# Patient Record
Sex: Female | Born: 1950 | Race: White | Hispanic: No | Smoking: Former smoker
Health system: Southern US, Community
[De-identification: ages and names within clinical notes are randomized; demographics above are authoritative.]

## PROBLEM LIST (undated history)

## (undated) DIAGNOSIS — M1990 Unspecified osteoarthritis, unspecified site: Secondary | ICD-10-CM

## (undated) DIAGNOSIS — E079 Disorder of thyroid, unspecified: Secondary | ICD-10-CM

## (undated) DIAGNOSIS — B019 Varicella without complication: Secondary | ICD-10-CM

## (undated) DIAGNOSIS — H409 Unspecified glaucoma: Secondary | ICD-10-CM

## (undated) HISTORY — PX: SPINE SURGERY: SHX786

## (undated) HISTORY — PX: GLAUCOMA SURGERY: SHX656

## (undated) HISTORY — DX: Varicella without complication: B01.9

## (undated) HISTORY — DX: Unspecified osteoarthritis, unspecified site: M19.90

## (undated) HISTORY — DX: Unspecified glaucoma: H40.9

## (undated) HISTORY — DX: Disorder of thyroid, unspecified: E07.9

## (undated) HISTORY — PX: BUNIONECTOMY: SHX129

## (undated) HISTORY — PX: CATARACT EXTRACTION W/ INTRAOCULAR LENS IMPLANT: SHX1309

---

## 1988-06-09 HISTORY — PX: GALLBLADDER SURGERY: SHX652

## 2014-09-19 DIAGNOSIS — H4011X1 Primary open-angle glaucoma, mild stage: Secondary | ICD-10-CM | POA: Diagnosis not present

## 2014-10-13 DIAGNOSIS — M7551 Bursitis of right shoulder: Secondary | ICD-10-CM | POA: Diagnosis not present

## 2014-10-30 ENCOUNTER — Encounter: Payer: Self-pay | Admitting: Primary Care

## 2014-10-30 ENCOUNTER — Ambulatory Visit (INDEPENDENT_AMBULATORY_CARE_PROVIDER_SITE_OTHER): Payer: Medicare Other | Admitting: Primary Care

## 2014-10-30 VITALS — BP 138/88 | HR 78 | Temp 98.0°F | Ht 63.0 in | Wt 243.8 lb

## 2014-10-30 DIAGNOSIS — R600 Localized edema: Secondary | ICD-10-CM | POA: Insufficient documentation

## 2014-10-30 DIAGNOSIS — M1990 Unspecified osteoarthritis, unspecified site: Secondary | ICD-10-CM | POA: Insufficient documentation

## 2014-10-30 DIAGNOSIS — R1900 Intra-abdominal and pelvic swelling, mass and lump, unspecified site: Secondary | ICD-10-CM | POA: Insufficient documentation

## 2014-10-30 DIAGNOSIS — E039 Hypothyroidism, unspecified: Secondary | ICD-10-CM

## 2014-10-30 DIAGNOSIS — Z23 Encounter for immunization: Secondary | ICD-10-CM

## 2014-10-30 DIAGNOSIS — R1013 Epigastric pain: Secondary | ICD-10-CM | POA: Insufficient documentation

## 2014-10-30 LAB — COMPREHENSIVE METABOLIC PANEL
ALT: 31 U/L (ref 0–35)
AST: 23 U/L (ref 0–37)
Albumin: 3.8 g/dL (ref 3.5–5.2)
Alkaline Phosphatase: 96 U/L (ref 39–117)
BUN: 17 mg/dL (ref 6–23)
CALCIUM: 9.1 mg/dL (ref 8.4–10.5)
CO2: 29 mEq/L (ref 19–32)
Chloride: 108 mEq/L (ref 96–112)
Creatinine, Ser: 0.9 mg/dL (ref 0.40–1.20)
GFR: 66.69 mL/min (ref >60.00)
GLUCOSE: 111 mg/dL — AB (ref 70–99)
Potassium: 4.4 mEq/L (ref 3.5–5.1)
Sodium: 140 mEq/L (ref 135–145)
Total Bilirubin: 0.4 mg/dL (ref 0.2–1.2)
Total Protein: 6.8 g/dL (ref 6.0–8.3)

## 2014-10-30 LAB — CBC WITH DIFFERENTIAL/PLATELET
Basophils Absolute: 0.1 10*3/uL (ref 0.0–0.1)
Basophils Relative: 0.8 % (ref 0.0–3.0)
Eosinophils Absolute: 0.1 10*3/uL (ref 0.0–0.7)
Eosinophils Relative: 2.1 % (ref 0.0–5.0)
HEMATOCRIT: 40 % (ref 36.0–46.0)
Hemoglobin: 13.3 g/dL (ref 12.0–15.0)
LYMPHS PCT: 18.3 % (ref 12.0–46.0)
Lymphs Abs: 1.1 10*3/uL (ref 0.7–4.0)
MCHC: 33.2 g/dL (ref 30.0–36.0)
MCV: 88.1 fl (ref 78.0–100.0)
Monocytes Absolute: 0.5 10*3/uL (ref 0.1–1.0)
Monocytes Relative: 8.1 % (ref 3.0–12.0)
Neutro Abs: 4.2 10*3/uL (ref 1.4–7.7)
Neutrophils Relative %: 70.7 % (ref 43.0–77.0)
PLATELETS: 164 10*3/uL (ref 150.0–400.0)
RBC: 4.54 Mil/uL (ref 3.87–5.11)
RDW: 13 % (ref 11.5–15.5)
WBC: 6 10*3/uL (ref 4.0–10.5)

## 2014-10-30 LAB — TSH: TSH: 5.78 u[IU]/mL — ABNORMAL HIGH (ref 0.35–4.50)

## 2014-10-30 MED ORDER — RANITIDINE HCL 150 MG PO TABS: 150.0000 mg | ORAL_TABLET | Freq: Every day | ORAL | Status: DC

## 2014-10-30 NOTE — Assessment & Plan Note (Signed)
Present proximal to umbilicus, suspect lipoma. Soft, immobile, non tender. Patient reports increasing growth since 1990's. Abdominal ultrasound to confirm. Also to evaluate epigastric tenderness.

## 2014-10-30 NOTE — Progress Notes (Signed)
Subjective:    Patient ID: Madison Calhoun, female    DOB: 09/12/1950, 64 y.o.   MRN: [id number]  HPI  Madison Calhoun is a 64 year old female who presents today to establish care and discuss the problems mentioned below. Will obtain old records.  1) Hypothyroidism: Diagnosed in 1996 and was placed on medication (unknown) then. She's not been on medication since the 1990's because the medication caused weight gain. She's not had PCP since 2007. She reports feeling tired throughout the day. Denies palpitations.   2) Arthritis: Evaluation at [facility] on 10/16/2014 for right shoulder pain. Xray obtained during visit which showed degenerative changes to the glenohumeral joint, sclerosis in the greater tuberosity region with moderate acromioclavicular degenerative changes. She was provided a prescription for Mobic 15 mg which she took once and has not taken again due drowsiness.   3) Glaucoma: Diagnosed in mid 1990's. She had laser surgery in 1998 both eyes for glaucoma. She also had cataract implants on both eyes in 2004.   4) Abdominal Mass: Present to epigastric region since mid 1990's and has had steady growth since. Denies pain, bloody stools. She's never had evaluation including CT, ultrasound.  5) Lower extremity edema: Present to right ankle with tightness and swelling. Edema occurs once every few weeks, she will occasionally elevate her legs which will provide relief. The edema does not bother her at this point to initiate any medication.  Review of Systems  Constitutional: Positive for fatigue. Negative for unexpected weight change.  HENT: Negative for rhinorrhea.   Respiratory: Negative for cough and shortness of breath.   Cardiovascular: Positive for leg swelling. Negative for chest pain.  Gastrointestinal: Negative for diarrhea and constipation.  Genitourinary: Negative for dysuria and frequency.  Musculoskeletal: Positive for arthralgias.  Skin: Negative for  rash.  Allergic/Immunologic: Negative for environmental allergies.  Neurological: Negative for dizziness and headaches.  Psychiatric/Behavioral:       Denies concerns for anxiety or depression       Past Medical History  Diagnosis Date  . Arthritis   . Chicken pox   . Glaucoma   . Thyroid disease     History   Social History  . Marital Status: Legally Separated    Spouse Name: N/A  . Number of Children: N/A  . Years of Education: N/A   Occupational History  . Not on file.   Social History Main Topics  . Smoking status: Never Smoker   . Smokeless tobacco: Not on file  . Alcohol Use: No  . Drug Use: No  . Sexual Activity: Not on file   Other Topics Concern  . Not on file   Social History Narrative   Single.   Twin children.   Retired. Worked as a Dealer.   Enjoys playing computer games, plays with her dogs.    Past Surgical History  Procedure Laterality Date  . Spine surgery    . Gallbladder surgery  1990  . Bunionectomy    . Cataract extraction w/ intraocular lens implant Bilateral   . Glaucoma surgery      Family History  Problem Relation Age of Onset  . Alcohol abuse Mother   . Cancer Mother     Uterine  . Heart disease Mother   . Mental illness Mother     Alzheimers  . Hyperlipidemia Mother   . Hypertension Mother   . Kidney disease Mother   . Alcohol abuse Father   . Heart disease  Father   . Heart disease Brother   . Arthritis Brother   . Hyperlipidemia Brother   . Hypertension Brother   . Kidney disease Brother   . Heart disease Brother   . Hyperlipidemia Brother   . Hypertension Brother     No Known Allergies  No current outpatient prescriptions on file prior to visit.   No current facility-administered medications on file prior to visit.    BP 138/88 mmHg  Pulse 78  Temp(Src) 98 F (36.7 C) (Oral)  Ht 5\' 3"  (1.6 m)  Wt 243 lb 12.8 oz (110.587 kg)  BMI 43.20 kg/m2  SpO2 95%    Objective:   Physical  Exam  Constitutional: She is oriented to person, place, and time. She appears well-nourished.  Cardiovascular: Normal rate and regular rhythm.   Pulmonary/Chest: Effort normal and breath sounds normal.  Abdominal: Soft. Bowel sounds are normal. She exhibits mass. There is tenderness.    Tender to epigastric region below sternum only when laying supine.   Mass to epigastric region above umbilicus, feels like lipoma. Soft, non tender.   Musculoskeletal:  Pain and decreased ROM to abduction of right shoulder  Neurological: She is alert and oriented to person, place, and time. She has normal reflexes.  Skin: Skin is warm and dry.  Psychiatric: She has a normal mood and affect.          Assessment & Plan:

## 2014-10-30 NOTE — Assessment & Plan Note (Signed)
Present to right ankle and lower leg intermittently. Relief with elevation and is not bothersome. Will continue to monitor and consider low dose HCTZ if problematic.

## 2014-10-30 NOTE — Patient Instructions (Addendum)
Complete lab work prior to leaving today. I will notify you of your results. Start Zantac (Ranitidine) tablets for epigastric pain and if you develop reflux symptoms. Take 1 tablet by mouth daily. You will be contacted regarding your ultrasound  Please let us know if you have not heard back within one week, or stop by the front and ask for [facility]. Please schedule a physical with me in the next 3 months. You will also schedule a lab only appointment one week prior. We will discuss your lab results during your physical. It was a pleasure to meet you today! Please don't hesitate to call me with any questions. Welcome to [facility]!  Food Choices for Gastroesophageal Reflux Disease When you have gastroesophageal reflux disease (GERD), the foods you eat and your eating habits are very important. Choosing the right foods can help ease the discomfort of GERD. WHAT GENERAL GUIDELINES DO I NEED TO FOLLOW?  Choose fruits, vegetables, whole grains, low-fat dairy products, and low-fat meat, fish, and poultry.  Limit fats such as oils, salad dressings, butter, nuts, and avocado.  Keep a food diary to identify foods that cause symptoms.  Avoid foods that cause reflux. These may be different for different people.  Eat frequent small meals instead of three large meals each day.  Eat your meals slowly, in a relaxed setting.  Limit fried foods.  Cook foods using methods other than frying.  Avoid drinking alcohol.  Avoid drinking large amounts of liquids with your meals.  Avoid bending over or lying down until 2-3 hours after eating. WHAT FOODS ARE NOT RECOMMENDED? The following are some foods and drinks that may worsen your symptoms: Vegetables Tomatoes. Tomato juice. Tomato and spaghetti sauce. Chili peppers. Onion and garlic. Horseradish. Fruits Oranges, grapefruit, and lemon (fruit and juice). Meats High-fat meats, fish, and poultry. This includes hot dogs, ribs, ham, sausage, salami, and  bacon. Dairy Whole milk and chocolate milk. Sour cream. Cream. Butter. Ice cream. Cream cheese.  Beverages Coffee and tea, with or without caffeine. Carbonated beverages or energy drinks. Condiments Hot sauce. Barbecue sauce.  Sweets/Desserts Chocolate and cocoa. Donuts. Peppermint and spearmint. Fats and Oils High-fat foods, including [place] fries and potato chips. Other Vinegar. Strong spices, such as black pepper, white pepper, red pepper, cayenne, curry powder, cloves, ginger, and chili powder. The items listed above may not be a complete list of foods and beverages to avoid. Contact your dietitian for more information. Document Released: 05/26/2005 Document Revised: 05/31/2013 Document Reviewed: 03/30/2013 [facility] Patient Information 2015 [place], [place]. This information is not intended to replace advice given to you by your health care provider. Make sure you discuss any questions you have with your health care provider.

## 2014-10-30 NOTE — Addendum Note (Signed)
Addended by: Jacqualin Combes on: 10/30/2014 03:16 PM   Modules accepted: Orders

## 2014-10-30 NOTE — Assessment & Plan Note (Signed)
Present upon supine position and wasn't discovered until exam. She had no complaints during ROS. Suspect this may be gastritis or GERD as she does have reflux occasionally with certain foods. Start Zantac 150 mg daily. Ultrasound to evaluate for this and abdominal lipoma. Follow up in one month

## 2014-10-30 NOTE — Assessment & Plan Note (Signed)
Present to right shoulder. Visited with Dr. Tamala Julian at [facility] on 10/16/14 with xrays. Prescribed Mobic 15 mg which makes her drowsy. Instructed patient to cut tablets in half.

## 2014-10-30 NOTE — Progress Notes (Signed)
Pre visit review using our clinic review tool, if applicable. No additional management support is needed unless otherwise documented below in the visit note. 

## 2014-10-30 NOTE — Assessment & Plan Note (Signed)
Diagnosed in 1990's, was placed on medication, only took for short duration due to weight gain. She has had no monitoring since 1990's. Reports fatigue. Denies palpitations.  TSH today.

## 2014-10-31 ENCOUNTER — Telehealth: Payer: Self-pay | Admitting: Primary Care

## 2014-10-31 DIAGNOSIS — E039 Hypothyroidism, unspecified: Secondary | ICD-10-CM

## 2014-10-31 MED ORDER — LEVOTHYROXINE SODIUM 25 MCG PO TABS: 25.0000 ug | ORAL_TABLET | Freq: Every day | ORAL | Status: DC

## 2014-10-31 NOTE — Telephone Encounter (Signed)
Spoke with Ms. Knoche regarding her TSH. Started Levothyroxine 25 mcg and education provided regarding when and how to take. Repeat TSH next appointment.

## 2014-11-01 DIAGNOSIS — H4011X1 Primary open-angle glaucoma, mild stage: Secondary | ICD-10-CM | POA: Diagnosis not present

## 2014-11-03 ENCOUNTER — Ambulatory Visit
Admission: RE | Admit: 2014-11-03 | Discharge: 2014-11-03 | Disposition: A | Discharge status: Home / Self Care | Payer: Medicare Other | Source: Ambulatory Visit | Attending: Primary Care | Admitting: Primary Care

## 2014-11-03 DIAGNOSIS — D179 Benign lipomatous neoplasm, unspecified: Secondary | ICD-10-CM | POA: Diagnosis not present

## 2014-11-03 DIAGNOSIS — R1013 Epigastric pain: Secondary | ICD-10-CM | POA: Diagnosis not present

## 2014-11-03 DIAGNOSIS — R1906 Epigastric swelling, mass or lump: Secondary | ICD-10-CM | POA: Diagnosis present

## 2014-11-03 DIAGNOSIS — D171 Benign lipomatous neoplasm of skin and subcutaneous tissue of trunk: Secondary | ICD-10-CM | POA: Insufficient documentation

## 2014-12-21 ENCOUNTER — Other Ambulatory Visit: Payer: Self-pay | Admitting: Primary Care

## 2014-12-21 DIAGNOSIS — Z0000 Encounter for general adult medical examination without abnormal findings: Secondary | ICD-10-CM

## 2014-12-21 DIAGNOSIS — E039 Hypothyroidism, unspecified: Secondary | ICD-10-CM

## 2014-12-21 DIAGNOSIS — Z780 Asymptomatic menopausal state: Secondary | ICD-10-CM

## 2014-12-21 DIAGNOSIS — Z13820 Encounter for screening for osteoporosis: Secondary | ICD-10-CM

## 2014-12-21 DIAGNOSIS — Z1321 Encounter for screening for nutritional disorder: Secondary | ICD-10-CM

## 2014-12-21 DIAGNOSIS — Z136 Encounter for screening for cardiovascular disorders: Secondary | ICD-10-CM

## 2014-12-21 DIAGNOSIS — K219 Gastro-esophageal reflux disease without esophagitis: Secondary | ICD-10-CM

## 2014-12-26 ENCOUNTER — Other Ambulatory Visit: Payer: Medicare Other

## 2014-12-28 ENCOUNTER — Telehealth: Payer: Self-pay

## 2014-12-28 NOTE — Telephone Encounter (Signed)
Was unable to leave a message about having a Mammogram set up.

## 2015-01-02 ENCOUNTER — Encounter: Payer: Medicare Other | Admitting: Primary Care

## 2015-02-01 DIAGNOSIS — H4011X1 Primary open-angle glaucoma, mild stage: Secondary | ICD-10-CM | POA: Diagnosis not present

## 2015-03-05 DIAGNOSIS — H4011X1 Primary open-angle glaucoma, mild stage: Secondary | ICD-10-CM | POA: Diagnosis not present

## 2015-06-13 DIAGNOSIS — H401131 Primary open-angle glaucoma, bilateral, mild stage: Secondary | ICD-10-CM | POA: Diagnosis not present

## 2015-09-20 DIAGNOSIS — H401131 Primary open-angle glaucoma, bilateral, mild stage: Secondary | ICD-10-CM | POA: Diagnosis not present

## 2016-01-21 DIAGNOSIS — H401131 Primary open-angle glaucoma, bilateral, mild stage: Secondary | ICD-10-CM | POA: Diagnosis not present

## 2016-04-30 ENCOUNTER — Telehealth: Payer: Self-pay | Admitting: Primary Care

## 2016-04-30 NOTE — Telephone Encounter (Signed)
LVM for pt to call back and schedule AWV + labs with Lesia and OV 30 with PCP. °

## 2016-05-26 DIAGNOSIS — Z961 Presence of intraocular lens: Secondary | ICD-10-CM | POA: Diagnosis not present

## 2016-09-10 ENCOUNTER — Telehealth: Payer: Self-pay | Admitting: Primary Care

## 2016-09-10 NOTE — Telephone Encounter (Signed)
Left pt message asking to call Allison back directly at [phone] to schedule AWV.+ labs with Lesia and CPE with PCP. °

## 2016-09-22 DIAGNOSIS — H401131 Primary open-angle glaucoma, bilateral, mild stage: Secondary | ICD-10-CM | POA: Diagnosis not present

## 2016-10-08 NOTE — Telephone Encounter (Signed)
Left pt message asking to call Allison back directly at [phone] to schedule AWV.+ labs with Lesia and CPE with PCP. °

## 2017-01-29 ENCOUNTER — Telehealth: Payer: Self-pay | Admitting: Primary Care

## 2017-01-29 NOTE — Telephone Encounter (Signed)
Left pt message asking to call Allison back directly at [phone] to schedule AWV + labs with Lesia and CPE with PCP. ° °*NOTE* Never had AWV before °

## 2017-03-05 NOTE — Telephone Encounter (Signed)
Left pt message asking to call Allison back directly at [phone] to schedule AWV + labs with Lesia and CPE with PCP. ° °*NOTE* Never had AWV before °

## 2017-03-30 DIAGNOSIS — T781XXA Other adverse food reactions, not elsewhere classified, initial encounter: Secondary | ICD-10-CM | POA: Diagnosis not present

## 2017-04-01 DIAGNOSIS — H401191 Primary open-angle glaucoma, unspecified eye, mild stage: Secondary | ICD-10-CM | POA: Diagnosis not present

## 2021-05-08 DIAGNOSIS — H401131 Primary open-angle glaucoma, bilateral, mild stage: Secondary | ICD-10-CM | POA: Diagnosis not present

## 2021-05-15 DIAGNOSIS — H26493 Other secondary cataract, bilateral: Secondary | ICD-10-CM | POA: Diagnosis not present

## 2021-10-07 ENCOUNTER — Emergency Department (HOSPITAL_COMMUNITY): Payer: Medicare Other

## 2021-10-07 ENCOUNTER — Other Ambulatory Visit: Payer: Self-pay

## 2021-10-07 ENCOUNTER — Emergency Department (HOSPITAL_COMMUNITY)
Admission: EM | Admit: 2021-10-07 | Discharge: 2021-10-10 | Disposition: A | Discharge status: Home / Self Care | Payer: Medicare Other | Attending: Emergency Medicine | Admitting: Emergency Medicine

## 2021-10-07 ENCOUNTER — Encounter (HOSPITAL_COMMUNITY): Payer: Self-pay

## 2021-10-07 DIAGNOSIS — S82841A Displaced bimalleolar fracture of right lower leg, initial encounter for closed fracture: Secondary | ICD-10-CM | POA: Diagnosis not present

## 2021-10-07 DIAGNOSIS — S92301A Fracture of unspecified metatarsal bone(s), right foot, initial encounter for closed fracture: Secondary | ICD-10-CM | POA: Diagnosis not present

## 2021-10-07 DIAGNOSIS — E039 Hypothyroidism, unspecified: Secondary | ICD-10-CM | POA: Diagnosis not present

## 2021-10-07 DIAGNOSIS — Z20822 Contact with and (suspected) exposure to covid-19: Secondary | ICD-10-CM | POA: Insufficient documentation

## 2021-10-07 DIAGNOSIS — Z9181 History of falling: Secondary | ICD-10-CM | POA: Diagnosis not present

## 2021-10-07 DIAGNOSIS — M7989 Other specified soft tissue disorders: Secondary | ICD-10-CM | POA: Diagnosis not present

## 2021-10-07 DIAGNOSIS — Y92002 Bathroom of unspecified non-institutional (private) residence single-family (private) house as the place of occurrence of the external cause: Secondary | ICD-10-CM | POA: Insufficient documentation

## 2021-10-07 DIAGNOSIS — S92309A Fracture of unspecified metatarsal bone(s), unspecified foot, initial encounter for closed fracture: Secondary | ICD-10-CM

## 2021-10-07 DIAGNOSIS — R197 Diarrhea, unspecified: Secondary | ICD-10-CM | POA: Insufficient documentation

## 2021-10-07 DIAGNOSIS — Z79899 Other long term (current) drug therapy: Secondary | ICD-10-CM | POA: Diagnosis not present

## 2021-10-07 DIAGNOSIS — S8991XA Unspecified injury of right lower leg, initial encounter: Secondary | ICD-10-CM | POA: Diagnosis present

## 2021-10-07 DIAGNOSIS — R262 Difficulty in walking, not elsewhere classified: Secondary | ICD-10-CM | POA: Insufficient documentation

## 2021-10-07 DIAGNOSIS — W1830XA Fall on same level, unspecified, initial encounter: Secondary | ICD-10-CM | POA: Diagnosis not present

## 2021-10-07 LAB — CBC WITH DIFFERENTIAL/PLATELET
Abs Immature Granulocytes: 0.03 10*3/uL (ref 0.00–0.07)
Basophils Absolute: 0 10*3/uL (ref 0.0–0.1)
Basophils Relative: 0 %
Eosinophils Absolute: 0.1 10*3/uL (ref 0.0–0.5)
Eosinophils Relative: 1 %
HCT: 46 % (ref 36.0–46.0)
Hemoglobin: 14.4 g/dL (ref 12.0–15.0)
Immature Granulocytes: 0 %
Lymphocytes Relative: 9 %
Lymphs Abs: 0.8 10*3/uL (ref 0.7–4.0)
MCH: 29.3 pg (ref 26.0–34.0)
MCHC: 31.3 g/dL (ref 30.0–36.0)
MCV: 93.7 fL (ref 80.0–100.0)
Monocytes Absolute: 0.8 10*3/uL (ref 0.1–1.0)
Monocytes Relative: 10 %
Neutro Abs: 6.7 10*3/uL (ref 1.7–7.7)
Neutrophils Relative %: 80 %
Platelets: 159 10*3/uL (ref 150–400)
RBC: 4.91 MIL/uL (ref 3.87–5.11)
RDW: 12.6 % (ref 11.5–15.5)
WBC: 8.4 10*3/uL (ref 4.0–10.5)
nRBC: 0 % (ref 0.0–0.2)

## 2021-10-07 LAB — BASIC METABOLIC PANEL
Anion gap: 8 (ref 5–15)
BUN: 24 mg/dL — ABNORMAL HIGH (ref 8–23)
CO2: 22 mmol/L (ref 22–32)
Calcium: 9 mg/dL (ref 8.9–10.3)
Chloride: 110 mmol/L (ref 98–111)
Creatinine, Ser: 1.41 mg/dL — ABNORMAL HIGH (ref 0.44–1.00)
GFR, Estimated: 40 mL/min — ABNORMAL LOW (ref >60)
Glucose, Bld: 108 mg/dL — ABNORMAL HIGH (ref 70–99)
Potassium: 4.1 mmol/L (ref 3.5–5.1)
Sodium: 140 mmol/L (ref 135–145)

## 2021-10-07 LAB — RESP PANEL BY RT-PCR (FLU A&B, COVID) ARPGX2
Influenza A by PCR: NEGATIVE
Influenza B by PCR: NEGATIVE
SARS Coronavirus 2 by RT PCR: NEGATIVE

## 2021-10-07 MED ORDER — LIDOCAINE 5 % EX PTCH
1.0000 | MEDICATED_PATCH | CUTANEOUS | Status: DC
  Administered 2021-10-08: 1 via TRANSDERMAL
  Filled 2021-10-07 (×5): qty 1

## 2021-10-07 MED ORDER — ACETAMINOPHEN 325 MG PO TABS: 650.0000 mg | ORAL_TABLET | ORAL | Status: DC | PRN

## 2021-10-07 MED ORDER — HYDROCODONE-ACETAMINOPHEN 5-325 MG PO TABS
1.0000 | ORAL_TABLET | Freq: Four times a day (QID) | ORAL | Status: DC | PRN
  Administered 2021-10-08 (×2): 1 via ORAL
  Filled 2021-10-07 (×3): qty 1

## 2021-10-07 MED ORDER — DOCUSATE SODIUM 100 MG PO CAPS: 100.0000 mg | ORAL_CAPSULE | Freq: Every day | ORAL | Status: DC | PRN

## 2021-10-07 MED ORDER — ONDANSETRON HCL 4 MG PO TABS: 4.0000 mg | ORAL_TABLET | Freq: Three times a day (TID) | ORAL | Status: DC | PRN

## 2021-10-07 MED ORDER — ALUM & MAG HYDROXIDE-SIMETH 200-200-20 MG/5ML PO SUSP: 30.0000 mL | Freq: Four times a day (QID) | ORAL | Status: DC | PRN

## 2021-10-07 NOTE — Progress Notes (Signed)
Orthopedic Tech Progress Note ?Patient Details:  ?Madison Calhoun ?March 25, 1951 ?[id number] ? ?Ortho Devices ?Type of Ortho Device: Stirrup splint, Short leg splint, Cotton web roll ?Ortho Device/Splint Location: RLE ?Ortho Device/Splint Interventions: Ordered, Application, Adjustment ?  ?Post Interventions ?Patient Tolerated: Well ?Instructions Provided: Care of device ? ?Janit Pagan ?10/07/2021, 4:24 PM ? ?

## 2021-10-07 NOTE — NC FL2 (Signed)
?  [place] MEDICAID FL2 LEVEL OF CARE SCREENING TOOL  ?  ? ?IDENTIFICATION  ?Patient Name: ?Madison Calhoun Birthdate: 05-Dec-1950 Sex: female Admission Date (Current Location): ?10/07/2021  ?[place] and [place] Number: ? [place] ?  Facility and Address:  ?[facility] [place] [address] [place], [place] [address] ?     Provider Number: ?[id number]  ?Attending Physician Name and Address:  ?No att. providers found ? Relative Name and Phone Number:  ?Marcelino Freestone, sister in law, [phone] ?   ?Current Level of Care: ?Hospital Recommended Level of Care: ?[place] Prior Approval Number: ?  ? ?Date Approved/Denied: ?  PASRR Number: ?[id number] ? ?Discharge Plan: ?SNF ?  ? ?Current Diagnoses: ?Patient Active Problem List  ? Diagnosis Date Noted  ? Hypothyroidism 10/30/2014  ? Abdominal pain, epigastric 10/30/2014  ? Abdominal mass 10/30/2014  ? Arthritis 10/30/2014  ? Lower extremity edema 10/30/2014  ? ? ?Orientation RESPIRATION BLADDER Height & Weight   ?  ?Self, Time, Situation, Place ? Normal Continent Weight: 243 lbs  ?Height:  '5\' 4"'$  (162.6 cm)  ?BEHAVIORAL SYMPTOMS/MOOD NEUROLOGICAL BOWEL NUTRITION STATUS  ?    Continent Diet (Regular)  ?AMBULATORY STATUS COMMUNICATION OF NEEDS Skin   ?Extensive Assist Verbally Normal ?  ?  ?  ?    ?     ?     ? ? ?Personal Care Assistance Level of Assistance  ?Bathing, Feeding, Dressing Bathing Assistance: Limited assistance ?Feeding assistance: Independent ?Dressing Assistance: Limited assistance ?   ? ?Functional Limitations Info  ?Sight, Hearing, Speech Sight Info: Impaired (Wears glasses) ?Hearing Info: Adequate ?Speech Info: Adequate  ? ? ?SPECIAL CARE FACTORS FREQUENCY  ?    ?  ?  ?  ?  ?  ?  ?   ? ? ?Contractures Contractures Info: Not present  ? ? ?Additional Factors Info  ?Code Status, Allergies Code Status Info: Not on file ?Allergies Info: No known Allergies ?  ?  ?  ?   ? ?Current Medications (10/07/2021):  This is the current  hospital active medication list ?Current Facility-Administered Medications  ?Medication Dose Route Frequency Provider Last Rate Last Admin  ? lidocaine (LIDODERM) 5 % 1 patch  1 patch Transdermal Q24H Tacy Learn, PA-C      ? ?Current Outpatient Medications  ?Medication Sig Dispense Refill  ? levothyroxine (LEVOTHROID) 25 MCG tablet Take 1 tablet (25 mcg total) by mouth daily before breakfast. 30 tablet 5  ? ranitidine (ZANTAC) 150 MG tablet Take 1 tablet (150 mg total) by mouth daily. 30 tablet 2  ? ? ? ?Discharge Medications: ?Please see discharge summary for a list of discharge medications. ? ?Relevant Imaging Results: ? ?Relevant Lab Results: ? ? ?Additional Information ?SSN# [id number] ? ?Raina Mina, LCSWA ? ? ? ? ?

## 2021-10-07 NOTE — ED Provider Notes (Addendum)
?[facility] ?Provider Note ? ? ?CSN: [id number] ?Arrival date & time: 10/07/21  1219 ? ?  ? ?History ? ?Chief Complaint  ?Patient presents with  ? Foot Injury  ? ? ?Madison Calhoun is a 71 y.o. female. ? ?71 year old female presents with complaint of right foot and ankle injury which occurred yesterday.  Patient states that she had diarrhea yesterday, spent an extensive amount of time sitting on the commode which resulted in numbness to both of her feet.  When she stood up to walk away from the commode, she fell and feels like she dislocated her right ankle.  Patient was able to pull her ankle back into alignment, states it was numb and not painful at that time however is now painful and swollen.  She denies loss of consciousness or any other injuries, complaints, concerns. ? ? ?  ? ?Home Medications ?Prior to Admission medications   ?Medication Sig Start Date End Date Taking? Authorizing Provider  ?levothyroxine (LEVOTHROID) 25 MCG tablet Take 1 tablet (25 mcg total) by mouth daily before breakfast. 10/31/14   Pleas Koch, NP  ?ranitidine (ZANTAC) 150 MG tablet Take 1 tablet (150 mg total) by mouth daily. 10/30/14   Pleas Koch, NP  ?   ? ?Allergies    ?Patient has no known allergies.   ? ?Review of Systems   ?Review of Systems ?Negative except as per HPI ?Physical Exam ?Updated Vital Signs ?BP (!) 147/85 (BP Location: Right Arm)   Pulse 93   Temp 98.3 ?F (36.8 ?C) (Oral)   Resp 18   Ht '5\' 4"'$  (1.626 m)   SpO2 93%   BMI 41.85 kg/m?  ?Physical Exam ?Vitals and nursing note reviewed.  ?Constitutional:   ?   General: She is not in acute distress. ?   Appearance: She is well-developed. She is not diaphoretic.  ?HENT:  ?   Head: Normocephalic and atraumatic.  ?Cardiovascular:  ?   Pulses: Normal pulses.  ?Pulmonary:  ?   Effort: Pulmonary effort is normal.  ?Musculoskeletal:     ?   General: Swelling and tenderness present. No deformity.  ?   Right ankle:  Tenderness present over the lateral malleolus and medial malleolus. No base of 5th metatarsal or proximal fibula tenderness. Decreased range of motion.  ?   Right Achilles Tendon: Normal.  ?   Right foot: Swelling and tenderness present. Normal pulse.  ?   Comments: Swelling, tenderness to medial and lateral right ankle as well as through the midfoot.  Sensation intact, pedal pulse present.    ?Skin: ?   General: Skin is warm and dry.  ?   Findings: Bruising present.  ?Neurological:  ?   Mental Status: She is alert and oriented to person, place, and time.  ?   Sensory: No sensory deficit.  ?   Motor: No weakness.  ?Psychiatric:     ?   Behavior: Behavior normal.  ? ? ?ED Results / Procedures / Treatments   ?Labs ?(all labs ordered are listed, but only abnormal results are displayed) ?Labs Reviewed  ?BASIC METABOLIC PANEL - Abnormal; Notable for the following components:  ?    Result Value  ? Glucose, Bld 108 (*)   ? BUN 24 (*)   ? Creatinine, Ser 1.41 (*)   ? GFR, Estimated 40 (*)   ? All other components within normal limits  ?RESP PANEL BY RT-PCR (FLU A&B, COVID) ARPGX2  ?CBC WITH DIFFERENTIAL/PLATELET  ? ? ?  EKG ?None ? ?Radiology ?DG Ankle Complete Right ? ?Result Date: 10/07/2021 ?CLINICAL DATA:  Right ankle and foot pain after falling. Previous foot surgery. EXAM: RIGHT FOOT COMPLETE - 3+ VIEW; RIGHT ANKLE - COMPLETE 3+ VIEW COMPARISON:  None. FINDINGS: There are small acute avulsion fractures of both malleoli. There is an anteriorly displaced component from the lateral malleolus on the lateral views. No widening of the ankle mortise. Posttraumatic deformity of the distal fibular diaphysis. The talus and calcaneus appear normal. There are nondisplaced acute fractures through the bases of the 2nd, 3rd and 4th metatarsals, without definite intra-articular extension. Probable old healed fracture of the 1st metatarsal. Previous resection of the 5th metatarsal head. Mild degenerative changes at the 1st and 2nd  metatarsophalangeal joints. Soft tissue swelling about the ankle, extending into the dorsal foot. No evidence of foreign body. IMPRESSION: 1. Acute mildly displaced fractures of both malleoli at the ankle. No widening of the ankle mortise or tarsal bone fractures identified. 2. Nondisplaced acute extra-articular fractures of the 2nd, 3rd and 4th metatarsal bases. 3. Old fractures and postsurgical changes as described. Electronically Signed   By: Richardean Sale M.D.   On: 10/07/2021 13:36  ? ?CT Ankle Right Wo Contrast ? ?Result Date: 10/07/2021 ?CLINICAL DATA:  Ankle trauma. Fracture. Right foot and ankle pain since falling yesterday. EXAM: CT OF THE RIGHT ANKLE WITHOUT CONTRAST TECHNIQUE: Multidetector CT imaging of the right ankle was performed according to the standard protocol. Multiplanar CT image reconstructions were also generated. RADIATION DOSE REDUCTION: This exam was performed according to the departmental dose-optimization program which includes automated exposure control, adjustment of the mA and/or kV according to patient size and/or use of iterative reconstruction technique. COMPARISON:  Right ankle and foot radiographs 10/07/2021. FINDINGS: Right ankle: Bones/Joint/Cartilage There is again a comminuted and mildly displaced fracture of the distal fibula seen on today's radiographs. There is a tiny minimally displaced fracture of the distal tip of the medial malleolus. There is mild curvilinear density just anterior to the talar body (sagittal series 11 images 22 through 25) which appears to represent an additional superficial anterior talar head acute fracture. There is additional lucency that may represent additional fracture extending throughout the rest of the anterior superficial aspect of the talar head to the medial cortex as well (axial series 2 images 50 through 53, sagittal series 11, images 24 through 38). Ligaments Suboptimally assessed by CT. Muscles and Tendons Normal density and size of  the regional musculature. No gross tendon tear. Soft tissues Moderate diffuse lateral distal calf and ankle subcutaneous fat and dorsal midfoot subcutaneous fat edema and swelling. Additional mild-to-moderate edema and swelling within the medial malleolar subcutaneous fat. -- Right foot: Bones/Joint/Cartilage There are nondisplaced curvilinear acute fractures of the proximal metadiaphyses of the second through fourth metatarsals, as seen on prior radiographs. Postsurgical change of resection of the distal aspect of fifth metatarsal, chronic. Old healed mildly displaced fracture of the first metatarsal with resultant mild foreshortening. Ligaments Not well evaluated by CT. Muscles and Tendons Moderate dorsal and plantar foot muscle atrophy and fatty infiltration diffusely. Soft tissues Moderate dorsal midfoot and mild great toe metatarsophalangeal joint region soft tissue swelling and edema. IMPRESSION:: IMPRESSION: 1. Comminuted and mildly displaced acute fracture of the distal fibula. 2. Minimally displaced fracture of the distal aspect of the medial malleolus. 3. Probable nondisplaced fracture of the far anterior cortex of the talar head. 4. Nondisplaced acute fractures of the second through fourth metatarsal proximal metadiaphysis. Electronically Signed  By: Yvonne Kendall M.D.   On: 10/07/2021 17:57  ? ?CT Foot Right Wo Contrast ? ?Result Date: 10/07/2021 ?CLINICAL DATA:  Ankle trauma. Fracture. Right foot and ankle pain since falling yesterday. EXAM: CT OF THE RIGHT ANKLE WITHOUT CONTRAST TECHNIQUE: Multidetector CT imaging of the right ankle was performed according to the standard protocol. Multiplanar CT image reconstructions were also generated. RADIATION DOSE REDUCTION: This exam was performed according to the departmental dose-optimization program which includes automated exposure control, adjustment of the mA and/or kV according to patient size and/or use of iterative reconstruction technique. COMPARISON:   Right ankle and foot radiographs 10/07/2021. FINDINGS: Right ankle: Bones/Joint/Cartilage There is again a comminuted and mildly displaced fracture of the distal fibula seen on today's radiographs. There is a tiny minimally

## 2021-10-07 NOTE — TOC Initial Note (Signed)
Transition of Care (TOC) - Initial/Assessment Note  ? ? ?Patient Details  ?Name: Madison Calhoun ?MRN: [id number] ?Date of Birth: [birth year] ? ?Transition of Care (TOC) CM/SW Contact:    ?Raina Mina, LCSWA ?Phone Number: ?10/07/2021, 5:35 PM ? ?Clinical Narrative: Patient agreed with the recommendations for SNF placement. Patient stated her daughter is usually her caretaker but she is in [place] for work. Patient stated her sister in law, Marcelino Freestone, [phone] will be her point of contact. Patient stated she lives in [place] and prefers [place] area [facility] [employer] [facility]. Patient stated the [place] SNF's are too far. CSW did explain [place] is limited to facilities. SNF will start SNF work-up.                  ? ? ?Expected Discharge Plan: [place] ?Barriers to Discharge: Continued Medical Work up ? ? ?Patient Goals and CMS Choice ?Patient states their goals for this hospitalization and ongoing recovery are:: Return home ?  ?  ? ?Expected Discharge Plan and Services ?Expected Discharge Plan: [place] ?  ?  ?  ?Living arrangements for the past 2 months: [place] ?                ?  ?  ?  ?  ?  ?  ?  ?  ?  ?  ? ?Prior Living Arrangements/Services ?Living arrangements for the past 2 months: [place] ?Lives with:: Self ?Patient language and need for interpreter reviewed:: Yes ?       ?Need for Family Participation in Patient Care: Yes (Comment) ?Care giver support system in place?: Yes (comment) ?  ?Criminal Activity/Legal Involvement Pertinent to Current Situation/Hospitalization: No - Comment as needed ? ?Activities of Daily Living ?  ?  ? ?Permission Sought/Granted ?Permission sought to share information with : Family Supports ?Permission granted to share information with : Yes, Release of Information Signed ? Share Information with NAME: Marcelino Freestone ?   ? Permission granted to share info w Relationship: Sister in law ? Permission granted to share info w Contact  Information: [phone] ? ?Emotional Assessment ?Appearance:: Appears stated age ?Attitude/Demeanor/Rapport: Engaged ?Affect (typically observed): Accepting ?Orientation: : Oriented to Self, Oriented to Place, Oriented to  Time, Oriented to Situation ?Alcohol / Substance Use: Not Applicable ?Psych Involvement: No (comment) ? ?Admission diagnosis:  POSS BROKEN FOOT ?Patient Active Problem List  ? Diagnosis Date Noted  ? Hypothyroidism 10/30/2014  ? Abdominal pain, epigastric 10/30/2014  ? Abdominal mass 10/30/2014  ? Arthritis 10/30/2014  ? Lower extremity edema 10/30/2014  ? ?PCP:  Pleas Koch, NP ?Pharmacy:   ?[facility] ?[place] [address] ?Phone: [phone] Fax: [phone] ? ? ? ? ?Social Determinants of Health (SDOH) Interventions ?  ? ?Readmission Risk Interventions ?   ? View : No data to display.  ?  ?  ?  ? ? ? ?

## 2021-10-07 NOTE — Evaluation (Signed)
Physical Therapy Evaluation ?Patient Details ?Name: Madison Calhoun ?MRN: [id number] ?DOB: 06/11/1950 ?Today's Date: 10/07/2021 ? ?History of Present Illness ? Pt is a 71 y/o female presenting to the ED secondary to fall with R ankle and foot pain. Found to have Acute mildly displaced fractures of both malleoli at the ankle and 2-4 metatarsal base fractures on the R. PMH includes DM.  ?Clinical Impression ? Pt admitted secondary to problem above with deficits below. Pt requiring mod A to stand and unable to maintain appropriate weightbearing precautions despite max cues. Was unable to take hop steps. Pt lives with her daughter, however, her daughter is on a travel assignment and is unable to assist. Feel pt is a high fall risk and will have difficulty caring for herself at home. Recommending SNF level therapies at d/c to address current deficits. Will continue to follow acutely.   ?   ? ?Recommendations for follow up therapy are one component of a multi-disciplinary discharge planning process, led by the attending physician.  Recommendations may be updated based on patient status, additional functional criteria and insurance authorization. ? ?Follow Up Recommendations Skilled nursing-short term rehab (<3 hours/day) ? ?  ?Assistance Recommended at Discharge Frequent or constant Supervision/Assistance  ?Patient can return home with the following ? A lot of help with walking and/or transfers;A lot of help with bathing/dressing/bathroom;Assistance with cooking/housework;Assist for transportation;Help with stairs or ramp for entrance ? ?  ?Equipment Recommendations Wheelchair (measurements PT);Wheelchair cushion (measurements PT);BSC/3in1  ?Recommendations for Other Services ?    ?  ?Functional Status Assessment Patient has had a recent decline in their functional status and demonstrates the ability to make significant improvements in function in a reasonable and predictable amount of time.  ? ?  ?Precautions /  Restrictions Precautions ?Precautions: Fall ?Restrictions ?Weight Bearing Restrictions: Yes ?RLE Weight Bearing: Non weight bearing  ? ?  ? ?Mobility ? Bed Mobility ?  ?  ?  ?  ?  ?  ?  ?General bed mobility comments: In recliner upon entry ?  ? ?Transfers ?Overall transfer level: Needs assistance ?Equipment used: Rolling walker (2 wheels) ?Transfers: Sit to/from Stand ?Sit to Stand: Mod assist ?  ?  ?  ?  ?  ?General transfer comment: Pt unable to maintain NWB on RLE and unable to take hop steps. Mod A for lift assist and steadying. ?  ? ?Ambulation/Gait ?  ?  ?  ?  ?  ?  ?  ?  ? ?Stairs ?  ?  ?  ?  ?  ? ?Wheelchair Mobility ?  ? ?Modified Rankin (Stroke Patients Only) ?  ? ?  ? ?Balance Overall balance assessment: Needs assistance ?Sitting-balance support: No upper extremity supported ?Sitting balance-Leahy Scale: Good ?  ?  ?Standing balance support: Bilateral upper extremity supported ?Standing balance-Leahy Scale: Poor ?Standing balance comment: Heavy reliance on BUE support ?  ?  ?  ?  ?  ?  ?  ?  ?  ?  ?  ?   ? ? ? ?Pertinent Vitals/Pain Pain Assessment ?Pain Assessment: No/denies pain  ? ? ?Home Living Family/patient expects to be discharged to:: Private residence ?Living Arrangements: Alone ?Available Help at Discharge: Family ?Type of Home: House ?Home Access: Stairs to enter ?  ?Entrance Stairs-Number of Steps: 2 ?  ?Home Layout: One level ?Home Equipment: None ?Additional Comments: Daughter is currently on a travel contract  ?  ?Prior Function Prior Level of Function : Independent/Modified Independent ?  ?  ?  ?  ?  ?  ?  ?  ?  ? ? ?  Hand Dominance  ?   ? ?  ?Extremity/Trunk Assessment  ? Upper Extremity Assessment ?Upper Extremity Assessment: Defer to OT evaluation ?  ? ?Lower Extremity Assessment ?Lower Extremity Assessment: RLE deficits/detail ?RLE Deficits / Details: RLE in splint ?  ? ?Cervical / Trunk Assessment ?Cervical / Trunk Assessment: Normal  ?Communication  ? Communication: No difficulties   ?Cognition Arousal/Alertness: Awake/alert ?Behavior During Therapy: [facility] for tasks assessed/performed ?Overall Cognitive Status: Impaired/Different from baseline ?Area of Impairment: Safety/judgement ?  ?  ?  ?  ?  ?  ?  ?  ?  ?  ?  ?  ?Safety/Judgement: Decreased awareness of deficits, Decreased awareness of safety ?  ?  ?General Comments: Decreased awareness of safety and current deficits. ?  ?  ? ?  ?General Comments General comments (skin integrity, edema, etc.): Pt's sister in law present. Spoke with pt's daughter on the phone as well and explained mobility concerns and concerns about pt going home ? ?  ?Exercises    ? ?Assessment/Plan  ?  ?PT Assessment Patient needs continued PT services  ?PT Problem List Decreased strength;Decreased activity tolerance;Decreased range of motion;Decreased balance;Decreased mobility;Decreased knowledge of use of DME;Decreased safety awareness;Decreased knowledge of precautions ? ?   ?  ?PT Treatment Interventions DME instruction;Gait training;Functional mobility training;Therapeutic activities;Therapeutic exercise;Balance training;Patient/family education;Wheelchair mobility training   ? ?PT Goals (Current goals can be found in the Care Plan section)  ?Acute Rehab PT Goals ?Patient Stated Goal: to get better ?PT Goal Formulation: With patient ?Time For Goal Achievement: 10/21/21 ?Potential to Achieve Goals: Good ? ?  ?Frequency Min 3X/week ?  ? ? ?Co-evaluation   ?  ?  ?  ?  ? ? ?  ?AM-PAC PT "6 Clicks" Mobility  ?Outcome Measure Help needed turning from your back to your side while in a flat bed without using bedrails?: A Little ?Help needed moving from lying on your back to sitting on the side of a flat bed without using bedrails?: A Little ?Help needed moving to and from a bed to a chair (including a wheelchair)?: A Lot ?Help needed standing up from a chair using your arms (e.g., wheelchair or bedside chair)?: A Lot ?Help needed to walk in hospital room?: Total ?Help needed  climbing 3-5 steps with a railing? : Total ?6 Click Score: 12 ? ?  ?End of Session Equipment Utilized During Treatment: Gait belt ?Activity Tolerance: Patient tolerated treatment well ?Patient left: in chair;with family/visitor present (in chair in ED) ?Nurse Communication: Mobility status ?PT Visit Diagnosis: Unsteadiness on feet (R26.81);History of falling (Z91.81);Difficulty in walking, not elsewhere classified (R26.2) ?  ? ?Time: 3149-7026 ?PT Time Calculation (min) (ACUTE ONLY): 25 min ? ? ?Charges:   PT Evaluation ?$PT Eval Moderate Complexity: 1 Mod ?PT Treatments ?$Therapeutic Activity: 8-22 mins ?  ?   ? ? ?Reuel Derby, PT, DPT  ?Acute Rehabilitation Services  ?Pager: [phone] ?Office: [phone] ? ? ?[place] ?10/07/2021, 4:10 PM ?

## 2021-10-07 NOTE — ED Triage Notes (Signed)
Pt states she fell yesterday morning and it now having significant swelling and pain to her right ankle and foot. No other injuries from the fall. Pt states "I had to pop my ankle back into place" ?

## 2021-10-07 NOTE — ED Provider Triage Note (Signed)
Emergency Medicine Provider Triage Evaluation Note ? ?Madison Calhoun , a 71 y.o. female  was evaluated in triage.  Pt complains of right foot and ankle pain since falling yesterday.  Patient reports that her feet went numb because "I was on the toilet too long".  Patient reports that she stood up and do her foot numbness fell.  Patient denies any her head or being on blood thinners.  Patient denies hitting consciousness.  Patient states that she has had increased right ankle and foot pain since this time. ? ?Review of Systems  ?Positive:  ?Negative:  ? ?Physical Exam  ?BP (!) 147/85 (BP Location: Right Arm)   Pulse 93   Temp 98.3 ?F (36.8 ?C) (Oral)   Resp 18   Ht '5\' 4"'$  (1.626 m)   SpO2 93%   BMI 41.85 kg/m?  ?Gen:   Awake, no distress   ?Resp:  Normal effort  ?MSK:   Moves extremities without difficulty  ?Other:  Right foot and ankle neurovascularly intact.  Limited range of motion.  2+ DP pulse. ? ?Medical Decision Making  ?Medically screening exam initiated at 1:08 PM.  Appropriate orders placed.  Madison Calhoun was informed that the remainder of the evaluation will be completed by another provider, this initial triage assessment does not replace that evaluation, and the importance of remaining in the ED until their evaluation is complete. ? ? ?  ?Azucena Cecil, PA-C ?10/07/21 1309 ? ?

## 2021-10-08 NOTE — Evaluation (Signed)
Occupational Therapy Evaluation ?Patient Details ?Name: Madison Calhoun ?MRN: [id number] ?DOB: 02-Aug-1950 ?Today's Date: 10/08/2021 ? ? ?History of Present Illness Pt is a 71 y/o female presenting to the ED secondary to fall with R ankle and foot pain. Found to have Acute mildly displaced fractures of both malleoli at the ankle and 2-4 metatarsal base fractures on the R. PMH includes DM.  ? ?Clinical Impression ?  ?Pt admitted for above and limited by problem list below, including R LE NWB, impaired balance, decreased activity tolerance and decreased safety.  PTA patient reports independent, living alone and driving. Currently requires setup for seated UB ADLs, mod assist for LB ADLs and min assist for squat pivot BSC transfers.  She reports having no assist at home and will currently need assist for transfers and ADLs.  Recommend continued OT services acutely and after dc at SNF level to optimize independence, safety and return to PLOF. Will follow.  ?   ? ?Recommendations for follow up therapy are one component of a multi-disciplinary discharge planning process, led by the attending physician.  Recommendations may be updated based on patient status, additional functional criteria and insurance authorization.  ? ?Follow Up Recommendations ? Skilled nursing-short term rehab (<3 hours/day)  ?  ?Assistance Recommended at Discharge Intermittent Supervision/Assistance  ?Patient can return home with the following A lot of help with walking and/or transfers;A lot of help with bathing/dressing/bathroom;Assistance with cooking/housework;Assist for transportation;Help with stairs or ramp for entrance ? ?  ?Functional Status Assessment ? Patient has had a recent decline in their functional status and demonstrates the ability to make significant improvements in function in a reasonable and predictable amount of time.  ?Equipment Recommendations ? BSC/3in1;Wheelchair (measurements OT);Wheelchair cushion (measurements OT)  ?   ?Recommendations for Other Services   ? ? ?  ?Precautions / Restrictions Precautions ?Precautions: Fall ?Restrictions ?Weight Bearing Restrictions: Yes ?RLE Weight Bearing: Non weight bearing  ? ?  ? ?Mobility Bed Mobility ?Overal bed mobility: Modified Independent ?  ?  ?  ?  ?  ?  ?General bed mobility comments: no assist required ?  ? ?Transfers ?Overall transfer level: Needs assistance ?  ?Transfers: Bed to chair/wheelchair/BSC ?  ?  ?Squat pivot transfers: Min assist ?  ?  ?  ?General transfer comment: squat pivot to/from BSC with good adherence to NWB R LE, min assist and cueing for safety ?  ? ?  ?Balance Overall balance assessment: Needs assistance ?Sitting-balance support: No upper extremity supported, Feet supported ?Sitting balance-Leahy Scale: Good ?  ?  ?  ?  ?  ?  ?  ?  ?  ?  ?  ?  ?  ?  ?  ?  ?   ? ?ADL either performed or assessed with clinical judgement  ? ?ADL Overall ADL's : Needs assistance/impaired ?  ?  ?Grooming: Set up;Sitting ?  ?  ?  ?  ?  ?Upper Body Dressing : Set up;Sitting ?  ?Lower Body Dressing: Moderate assistance;Sitting/lateral leans ?Lower Body Dressing Details (indicate cue type and reason): able to don L sock, requires assist with R clothing mgmt and over hips ?Toilet Transfer: Minimal assistance;Squat-pivot;BSC/3in1 ?  ?Toileting- Clothing Manipulation and Hygiene: Minimal assistance;Sitting/lateral lean ?Toileting - Clothing Manipulation Details (indicate cue type and reason): clothing mgmt ?  ?  ?Functional mobility during ADLs: Minimal assistance;Cueing for safety ?   ? ? ? ?Vision   ?Vision Assessment?: No apparent visual deficits  ?   ?Perception   ?  ?  Praxis   ?  ? ?Pertinent Vitals/Pain Pain Assessment ?Pain Assessment: No/denies pain  ? ? ? ?Hand Dominance Right ?  ?Extremity/Trunk Assessment Upper Extremity Assessment ?Upper Extremity Assessment: Generalized weakness ?  ?Lower Extremity Assessment ?Lower Extremity Assessment: Defer to PT evaluation ?RLE Deficits /  Details: RLE in splint ?  ?Cervical / Trunk Assessment ?Cervical / Trunk Assessment: Normal ?  ?Communication Communication ?Communication: No difficulties ?  ?Cognition Arousal/Alertness: Awake/alert ?Behavior During Therapy: [facility] for tasks assessed/performed ?Overall Cognitive Status: Impaired/Different from baseline ?Area of Impairment: Safety/judgement, Awareness, Problem solving ?  ?  ?  ?  ?  ?  ?  ?  ?  ?  ?  ?  ?Safety/Judgement: Decreased awareness of safety, Decreased awareness of deficits ?Awareness: Emergent ?Problem Solving: Slow processing, Requires verbal cues ?General Comments: pt with decreased awareness of safety and need for assist ?  ?  ?General Comments    ? ?  ?Exercises   ?  ?Shoulder Instructions    ? ? ?Home Living Family/patient expects to be discharged to:: Private residence ?Living Arrangements: Alone ?Available Help at Discharge: Family ?Type of Home: House ?Home Access: Stairs to enter ?Entrance Stairs-Number of Steps: 2 ?  ?Home Layout: One level ?  ?  ?Bathroom Shower/Tub: Tub/shower unit ?  ?Bathroom Toilet: Standard ?Bathroom Accessibility: No ?  ?Home Equipment: None ?  ?  ?  ? ?  ?Prior Functioning/Environment Prior Level of Function : Independent/Modified Independent;Driving ?  ?  ?  ?  ?  ?  ?  ?  ?  ? ?  ?  ?OT Problem List: Decreased strength;Decreased activity tolerance;Impaired balance (sitting and/or standing);Decreased safety awareness;Decreased knowledge of use of DME or AE;Decreased knowledge of precautions;Obesity ?  ?   ?OT Treatment/Interventions: Self-care/ADL training;Therapeutic exercise;DME and/or AE instruction;Therapeutic activities;Patient/family education;Balance training  ?  ?OT Goals(Current goals can be found in the care plan section) Acute Rehab OT Goals ?Patient Stated Goal: get better ?OT Goal Formulation: With patient ?Time For Goal Achievement: 10/22/21 ?Potential to Achieve Goals: Good  ?OT Frequency: Min 2X/week ?  ? ?Co-evaluation   ?  ?  ?  ?  ? ?   ?AM-PAC OT "6 Clicks" Daily Activity     ?Outcome Measure Help from another person eating meals?: None ?Help from another person taking care of personal grooming?: A Little ?Help from another person toileting, which includes using toliet, bedpan, or urinal?: A Little ?Help from another person bathing (including washing, rinsing, drying)?: A Little ?Help from another person to put on and taking off regular upper body clothing?: A Little ?Help from another person to put on and taking off regular lower body clothing?: A Lot ?6 Click Score: 18 ?  ?End of Session Equipment Utilized During Treatment: Gait belt ?Nurse Communication: Mobility status ? ?Activity Tolerance: Patient tolerated treatment well ?Patient left: in bed;with call bell/phone within reach ? ?OT Visit Diagnosis: Other abnormalities of gait and mobility (R26.89);Muscle weakness (generalized) (M62.81)  ?              ?Time: 8546-2703 ?OT Time Calculation (min): 19 min ?Charges:  OT General Charges ?$OT Visit: 1 Visit ?OT Evaluation ?$OT Eval Moderate Complexity: 1 Mod ? ?Jolaine Artist, OT ?Acute Rehabilitation Services ?Pager [phone] ?Office [phone] ? ? ?[place] ?10/08/2021, 11:34 AM ?

## 2021-10-08 NOTE — ED Notes (Signed)
PT awake and OT with PT in room. ?

## 2021-10-08 NOTE — ED Notes (Signed)
Placed breakfast order ?

## 2021-10-08 NOTE — ED Notes (Signed)
PT sleeping

## 2021-10-08 NOTE — ED Notes (Signed)
RT lower leg in soft splint. Toes warm and dry Pt moves all toes. ?

## 2021-10-08 NOTE — Progress Notes (Signed)
Patient accepted at [facility]. Facility unable to take patient until Thursday morning.  ?

## 2021-10-09 DIAGNOSIS — S82841A Displaced bimalleolar fracture of right lower leg, initial encounter for closed fracture: Secondary | ICD-10-CM | POA: Diagnosis not present

## 2021-10-09 NOTE — ED Notes (Signed)
Pt has breakfast tray . ?

## 2021-10-09 NOTE — ED Notes (Addendum)
Note in error.

## 2021-10-09 NOTE — ED Notes (Signed)
Visitor in room ?

## 2021-10-09 NOTE — ED Provider Notes (Signed)
Emergency Medicine Observation Re-evaluation Note ? ?Madison Calhoun is a 71 y.o. female, seen on rounds today.  Pt initially presented to the ED for complaints of Foot Injury ?Currently, the patient is awaiting SNF placement.  Pt accepted at [facility].  Family can't take her there until tomorrow. ? ?Physical Exam  ?BP 100/64 (BP Location: Right Arm)   Pulse 74   Temp 97.7 ?F (36.5 ?C) (Oral)   Resp 20   Ht '5\' 4"'$  (1.626 m)   SpO2 92%   BMI 41.85 kg/m?  ?Physical Exam ?General: awake and alert ?Cardiac: rrr ?Lungs: ctab ?Psych: calm ? ?ED Course / MDM  ?EKG:  ? ?I have reviewed the labs performed to date as well as medications administered while in observation.  Recent changes in the last 24 hours include none. ? ?Plan  ?Current plan is for SNF placement.  Pt has a bimalleolar fx.  PT and OT are following. ? Madison Calhoun is not under involuntary commitment. ? ? ?  ?Isla Pence, MD ?10/09/21 0935 ? ?

## 2021-10-09 NOTE — ED Notes (Signed)
Breakfast order placed ?

## 2021-10-10 DIAGNOSIS — Z7401 Bed confinement status: Secondary | ICD-10-CM | POA: Diagnosis not present

## 2021-10-10 DIAGNOSIS — K5904 Chronic idiopathic constipation: Secondary | ICD-10-CM | POA: Diagnosis not present

## 2021-10-10 DIAGNOSIS — E039 Hypothyroidism, unspecified: Secondary | ICD-10-CM | POA: Diagnosis not present

## 2021-10-10 DIAGNOSIS — R296 Repeated falls: Secondary | ICD-10-CM | POA: Diagnosis not present

## 2021-10-10 DIAGNOSIS — W1811XD Fall from or off toilet without subsequent striking against object, subsequent encounter: Secondary | ICD-10-CM | POA: Diagnosis not present

## 2021-10-10 DIAGNOSIS — K219 Gastro-esophageal reflux disease without esophagitis: Secondary | ICD-10-CM | POA: Diagnosis not present

## 2021-10-10 DIAGNOSIS — Z20822 Contact with and (suspected) exposure to covid-19: Secondary | ICD-10-CM | POA: Diagnosis not present

## 2021-10-10 DIAGNOSIS — S92301D Fracture of unspecified metatarsal bone(s), right foot, subsequent encounter for fracture with routine healing: Secondary | ICD-10-CM | POA: Diagnosis not present

## 2021-10-10 DIAGNOSIS — S82841D Displaced bimalleolar fracture of right lower leg, subsequent encounter for closed fracture with routine healing: Secondary | ICD-10-CM | POA: Diagnosis not present

## 2021-10-10 DIAGNOSIS — W19XXXD Unspecified fall, subsequent encounter: Secondary | ICD-10-CM | POA: Diagnosis not present

## 2021-10-10 DIAGNOSIS — M1990 Unspecified osteoarthritis, unspecified site: Secondary | ICD-10-CM | POA: Diagnosis not present

## 2021-10-10 DIAGNOSIS — S92309D Fracture of unspecified metatarsal bone(s), unspecified foot, subsequent encounter for fracture with routine healing: Secondary | ICD-10-CM | POA: Diagnosis not present

## 2021-10-10 DIAGNOSIS — R197 Diarrhea, unspecified: Secondary | ICD-10-CM | POA: Diagnosis not present

## 2021-10-10 DIAGNOSIS — E038 Other specified hypothyroidism: Secondary | ICD-10-CM | POA: Diagnosis not present

## 2021-10-10 DIAGNOSIS — K5900 Constipation, unspecified: Secondary | ICD-10-CM | POA: Diagnosis not present

## 2021-10-10 DIAGNOSIS — S82841A Displaced bimalleolar fracture of right lower leg, initial encounter for closed fracture: Secondary | ICD-10-CM | POA: Diagnosis not present

## 2021-10-10 DIAGNOSIS — S99921D Unspecified injury of right foot, subsequent encounter: Secondary | ICD-10-CM | POA: Diagnosis not present

## 2021-10-10 DIAGNOSIS — S92301A Fracture of unspecified metatarsal bone(s), right foot, initial encounter for closed fracture: Secondary | ICD-10-CM | POA: Diagnosis not present

## 2021-10-10 DIAGNOSIS — M2550 Pain in unspecified joint: Secondary | ICD-10-CM | POA: Diagnosis not present

## 2021-10-10 DIAGNOSIS — Z79899 Other long term (current) drug therapy: Secondary | ICD-10-CM | POA: Diagnosis not present

## 2021-10-10 DIAGNOSIS — M1991 Primary osteoarthritis, unspecified site: Secondary | ICD-10-CM | POA: Diagnosis not present

## 2021-10-10 DIAGNOSIS — M25571 Pain in right ankle and joints of right foot: Secondary | ICD-10-CM | POA: Diagnosis not present

## 2021-10-10 MED ORDER — HYDROCODONE-ACETAMINOPHEN 5-325 MG PO TABS: 1.0000 | ORAL_TABLET | ORAL | 0 refills | Status: DC | PRN

## 2021-10-10 MED ORDER — DOCUSATE SODIUM 100 MG PO CAPS: 100.0000 mg | ORAL_CAPSULE | Freq: Two times a day (BID) | ORAL | 0 refills | Status: DC

## 2021-10-10 NOTE — Progress Notes (Signed)
Physical Therapy Treatment ?Patient Details ?Name: Madison Calhoun ?MRN: [id number] ?DOB: Mar 03, 1951 ?Today's Date: 10/10/2021 ? ? ?History of Present Illness Pt is a 71 y/o female presenting to the ED secondary to fall with R ankle and foot pain. Found to have Acute mildly displaced fractures of both malleoli at the ankle and 2-4 metatarsal base fractures on the R. PMH includes DM. ? ?  ?PT Comments  ? ? Pt progressing towards goals. Continues to exhibit decreased safety awareness, especially with transfers and required min A for steadying. Reviewed HEP with pt as well. Current recommendations for SNF appropriate. Will continue to follow acutely.  ?   ?Recommendations for follow up therapy are one component of a multi-disciplinary discharge planning process, led by the attending physician.  Recommendations may be updated based on patient status, additional functional criteria and insurance authorization. ? ?Follow Up Recommendations ? Skilled nursing-short term rehab (<3 hours/day) ?  ?  ?Assistance Recommended at Discharge Frequent or constant Supervision/Assistance  ?Patient can return home with the following A lot of help with walking and/or transfers;A lot of help with bathing/dressing/bathroom;Assistance with cooking/housework;Assist for transportation;Help with stairs or ramp for entrance ?  ?Equipment Recommendations ? Wheelchair (measurements PT);Wheelchair cushion (measurements PT);BSC/3in1  ?  ?Recommendations for Other Services   ? ? ?  ?Precautions / Restrictions Precautions ?Precautions: Fall ?Restrictions ?Weight Bearing Restrictions: Yes ?RLE Weight Bearing: Non weight bearing  ?  ? ?Mobility ? Bed Mobility ?Overal bed mobility: Modified Independent ?  ?  ?  ?  ?  ?  ?General bed mobility comments: no assist required ?  ? ?Transfers ?Overall transfer level: Needs assistance ?  ?Transfers: Bed to chair/wheelchair/BSC ?  ?  ?  ?Squat pivot transfers: Min assist ?  ?  ?General transfer comment: squat  pivot to/from chair for linen change of bed. Pt with decreased awareness of safety and required cues throughout transfer. Min A for steadying during transfer. ?  ? ?Ambulation/Gait ?  ?  ?  ?  ?  ?  ?  ?  ? ? ?Stairs ?  ?  ?  ?  ?  ? ? ?Wheelchair Mobility ?  ? ?Modified Rankin (Stroke Patients Only) ?  ? ? ?  ?Balance Overall balance assessment: Needs assistance ?Sitting-balance support: No upper extremity supported, Feet supported ?Sitting balance-Leahy Scale: Good ?  ?  ?  ?  ?  ?  ?  ?  ?  ?  ?  ?  ?  ?  ?  ?  ?  ? ?  ?Cognition Arousal/Alertness: Awake/alert ?Behavior During Therapy: [facility] for tasks assessed/performed ?Overall Cognitive Status: Impaired/Different from baseline ?Area of Impairment: Safety/judgement, Awareness, Problem solving ?  ?  ?  ?  ?  ?  ?  ?  ?  ?  ?  ?  ?Safety/Judgement: Decreased awareness of safety, Decreased awareness of deficits ?Awareness: Emergent ?Problem Solving: Slow processing, Requires verbal cues ?General Comments: pt with decreased awareness of safety and need for assist ?  ?  ? ?  ?Exercises General Exercises - Lower Extremity ?Ankle Circles/Pumps: AROM, Left, 10 reps, Supine ?Quad Sets: AROM, Both, 10 reps, Supine ?Heel Slides: AROM, Both, 10 reps ?Straight Leg Raises: AROM, Both, 10 reps ? ?  ?General Comments   ?  ?  ? ?Pertinent Vitals/Pain Pain Assessment ?Pain Assessment: No/denies pain  ? ? ?Home Living   ?  ?  ?  ?  ?  ?  ?  ?  ?  ?   ?  ?  Prior Function    ?  ?  ?   ? ?PT Goals (current goals can now be found in the care plan section) Acute Rehab PT Goals ?Patient Stated Goal: "to get out of here" ?PT Goal Formulation: With patient ?Time For Goal Achievement: 10/21/21 ?Potential to Achieve Goals: Good ?Progress towards PT goals: Progressing toward goals ? ?  ?Frequency ? ? ? Min 3X/week ? ? ? ?  ?PT Plan Current plan remains appropriate  ? ? ?Co-evaluation   ?  ?  ?  ?  ? ?  ?AM-PAC PT "6 Clicks" Mobility   ?Outcome Measure ? Help needed turning from your back to  your side while in a flat bed without using bedrails?: A Little ?Help needed moving from lying on your back to sitting on the side of a flat bed without using bedrails?: A Little ?Help needed moving to and from a bed to a chair (including a wheelchair)?: A Little ?Help needed standing up from a chair using your arms (e.g., wheelchair or bedside chair)?: A Lot ?Help needed to walk in hospital room?: Total ?Help needed climbing 3-5 steps with a railing? : Total ?6 Click Score: 13 ? ?  ?End of Session   ?Activity Tolerance: Patient tolerated treatment well ?Patient left: in bed;with call bell/phone within reach (on bed in ED) ?Nurse Communication: Mobility status ?PT Visit Diagnosis: Unsteadiness on feet (R26.81);History of falling (Z91.81);Difficulty in walking, not elsewhere classified (R26.2) ?  ? ? ?Time: 6789-3810 ?PT Time Calculation (min) (ACUTE ONLY): 17 min ? ?Charges:  $Therapeutic Activity: 8-22 mins          ?          ? ?Reuel Derby, PT, DPT  ?Acute Rehabilitation Services  ?Pager: [phone] ?Office: [phone] ? ? ? ?[place] ?10/10/2021, 10:10 AM ? ?

## 2021-10-10 NOTE — ED Provider Notes (Signed)
Emergency Medicine Observation Re-evaluation Note ? ?Madison Calhoun is a 71 y.o. female, seen on rounds today.  Pt initially presented to the ED for complaints of Foot Injury ?Currently, the patient is awaiting transfer to her facility. ? ?Physical Exam  ?BP (!) 124/109 (BP Location: Right Arm)   Pulse 90   Temp 98.4 ?F (36.9 ?C) (Oral)   Resp 20   Ht '5\' 4"'$  (1.626 m)   SpO2 95%   BMI 41.85 kg/m?  ?Physical Exam ?General: sitting in chair, no distress ?Lungs: normal effort ?MSK: Right leg in splint, toes appear normal color ? ? ?ED Course / MDM  ?EKG:  ? ?I have reviewed the labs performed to date as well as medications administered while in observation.  No recent changes in the last 24 hours. ? ?Plan  ?Current plan is for placement into [facility]. She is going there this morning. ? Madison Calhoun is not under involuntary commitment. ? ? ?  ?Sherwood Gambler, MD ?10/10/21 [phone] ? ?

## 2021-10-11 DIAGNOSIS — S92301D Fracture of unspecified metatarsal bone(s), right foot, subsequent encounter for fracture with routine healing: Secondary | ICD-10-CM | POA: Diagnosis not present

## 2021-10-11 DIAGNOSIS — W1811XD Fall from or off toilet without subsequent striking against object, subsequent encounter: Secondary | ICD-10-CM | POA: Diagnosis not present

## 2021-10-11 DIAGNOSIS — S99921D Unspecified injury of right foot, subsequent encounter: Secondary | ICD-10-CM | POA: Diagnosis not present

## 2021-10-11 DIAGNOSIS — M1991 Primary osteoarthritis, unspecified site: Secondary | ICD-10-CM | POA: Diagnosis not present

## 2021-10-11 DIAGNOSIS — E039 Hypothyroidism, unspecified: Secondary | ICD-10-CM | POA: Diagnosis not present

## 2021-10-11 DIAGNOSIS — K219 Gastro-esophageal reflux disease without esophagitis: Secondary | ICD-10-CM | POA: Diagnosis not present

## 2021-10-14 DIAGNOSIS — E039 Hypothyroidism, unspecified: Secondary | ICD-10-CM | POA: Diagnosis not present

## 2021-10-14 DIAGNOSIS — M1991 Primary osteoarthritis, unspecified site: Secondary | ICD-10-CM | POA: Diagnosis not present

## 2021-10-14 DIAGNOSIS — S92301D Fracture of unspecified metatarsal bone(s), right foot, subsequent encounter for fracture with routine healing: Secondary | ICD-10-CM | POA: Diagnosis not present

## 2021-10-14 DIAGNOSIS — K219 Gastro-esophageal reflux disease without esophagitis: Secondary | ICD-10-CM | POA: Diagnosis not present

## 2021-10-14 DIAGNOSIS — S99921D Unspecified injury of right foot, subsequent encounter: Secondary | ICD-10-CM | POA: Diagnosis not present

## 2021-10-14 DIAGNOSIS — W1811XD Fall from or off toilet without subsequent striking against object, subsequent encounter: Secondary | ICD-10-CM | POA: Diagnosis not present

## 2021-10-16 ENCOUNTER — Ambulatory Visit (INDEPENDENT_AMBULATORY_CARE_PROVIDER_SITE_OTHER): Payer: Medicare Other | Admitting: Orthopaedic Surgery

## 2021-10-16 ENCOUNTER — Ambulatory Visit (INDEPENDENT_AMBULATORY_CARE_PROVIDER_SITE_OTHER): Payer: Medicare Other

## 2021-10-16 DIAGNOSIS — K219 Gastro-esophageal reflux disease without esophagitis: Secondary | ICD-10-CM | POA: Diagnosis not present

## 2021-10-16 DIAGNOSIS — M25571 Pain in right ankle and joints of right foot: Secondary | ICD-10-CM

## 2021-10-16 DIAGNOSIS — M1991 Primary osteoarthritis, unspecified site: Secondary | ICD-10-CM | POA: Diagnosis not present

## 2021-10-16 DIAGNOSIS — S99921D Unspecified injury of right foot, subsequent encounter: Secondary | ICD-10-CM | POA: Diagnosis not present

## 2021-10-16 DIAGNOSIS — K5904 Chronic idiopathic constipation: Secondary | ICD-10-CM | POA: Diagnosis not present

## 2021-10-16 DIAGNOSIS — R296 Repeated falls: Secondary | ICD-10-CM | POA: Diagnosis not present

## 2021-10-16 DIAGNOSIS — W1811XD Fall from or off toilet without subsequent striking against object, subsequent encounter: Secondary | ICD-10-CM | POA: Diagnosis not present

## 2021-10-16 DIAGNOSIS — E038 Other specified hypothyroidism: Secondary | ICD-10-CM | POA: Diagnosis not present

## 2021-10-16 DIAGNOSIS — S92301D Fracture of unspecified metatarsal bone(s), right foot, subsequent encounter for fracture with routine healing: Secondary | ICD-10-CM | POA: Diagnosis not present

## 2021-10-16 NOTE — Progress Notes (Signed)
? ?                            ? ? ?Chief Complaint: Right ankle fracture ?  ? ? ?History of Present Illness:  ? ? ?Madison Calhoun is a 71 y.o. female presents today for follow-up status post right ankle dislocation in her bathroom.  This happened 1 week prior.  She had self reduced this and presented to the emergency room.  CT scan was obtained which showed bimalleolar ankle fracture as well as second through fourth metatarsal fractures of the base.  She denies any previous issues with her ankle.  She has been in a nursing home nonweightbearing in a splint.  She is here today for further assessment.  She denies any history of diabetes ? ? ? ?Surgical History:   ?None ? ?PMH/PSH/Family History/Social History/Meds/Allergies:   ? ?Past Medical History:  ?Diagnosis Date  ? Arthritis   ? Chicken pox   ? Glaucoma   ? Thyroid disease   ? ?Past Surgical History:  ?Procedure Laterality Date  ? BUNIONECTOMY    ? CATARACT EXTRACTION W/ INTRAOCULAR LENS IMPLANT Bilateral   ? GALLBLADDER SURGERY  1990  ? GLAUCOMA SURGERY    ? SPINE SURGERY    ? ?Social History  ? ?Socioeconomic History  ? Marital status: Legally Separated  ?  Spouse name: Not on file  ? Number of children: Not on file  ? Years of education: Not on file  ? Highest education level: Not on file  ?Occupational History  ? Not on file  ?Tobacco Use  ? Smoking status: Never  ? Smokeless tobacco: Not on file  ?Substance and Sexual Activity  ? Alcohol use: No  ?  Alcohol/week: 0.0 standard drinks  ? Drug use: No  ? Sexual activity: Not on file  ?Other Topics Concern  ? Not on file  ?Social History Narrative  ? Single.  ? Twin children.  ? Retired. Worked as a Dealer.  ? Enjoys playing computer games, plays with her dogs.  ? ?Social Determinants of Health  ? ?Financial Resource Strain: Not on file  ?Food Insecurity: Not on file  ?Transportation Needs: Not on file  ?Physical Activity: Not on file  ?Stress: Not on file  ?Social Connections: Not on file   ? ?Family History  ?Problem Relation Age of Onset  ? Alcohol abuse Mother   ? Cancer Mother   ?     Uterine  ? Heart disease Mother   ? Mental illness Mother   ?     Alzheimers  ? Hyperlipidemia Mother   ? Hypertension Mother   ? Kidney disease Mother   ? Alcohol abuse Father   ? Heart disease Father   ? Heart disease Brother   ? Arthritis Brother   ? Hyperlipidemia Brother   ? Hypertension Brother   ? Kidney disease Brother   ? Heart disease Brother   ? Hyperlipidemia Brother   ? Hypertension Brother   ? ?No Known Allergies ?Current Outpatient Medications  ?Medication Sig Dispense Refill  ? docusate sodium (COLACE) 100 MG capsule Take 1 capsule (100 mg total) by mouth every 12 (twelve) hours. 60 capsule 0  ? HYDROcodone-acetaminophen (NORCO) 5-325 MG tablet Take 1 tablet by mouth every 4 (four) hours as needed for severe pain. 20 tablet 0  ? levothyroxine (LEVOTHROID) 25 MCG tablet Take 1 tablet (25 mcg total) by mouth daily before breakfast. (  Patient not taking: Reported on 10/07/2021) 30 tablet 5  ? naproxen sodium (ALEVE) 220 MG tablet Take 440 mg by mouth 2 (two) times daily as needed (foot pain).    ? ranitidine (ZANTAC) 150 MG tablet Take 1 tablet (150 mg total) by mouth daily. (Patient not taking: Reported on 10/07/2021) 30 tablet 2  ? ?No current facility-administered medications for this visit.  ? ?No results found. ? ?Review of Systems:   ?A ROS was performed including pertinent positives and negatives as documented in the HPI. ? ?Physical Exam :   ?Constitutional: NAD and appears stated age ?Neurological: Alert and oriented ?Psych: Appropriate affect and cooperative ?There were no vitals taken for this visit.  ? ?Comprehensive Musculoskeletal Exam:   ? ?Tenderness palpation about the right ankle medial lateral malleolus.  2+ dorsalis pedis pulse.  She is able to fire EHL as well as tibialis anterior ? ?Imaging:   ?Xray (3 views right ankle): ?Concentric ankle joint with a very small fibular and medial  malleolar avulsion type fracture ? ?CT (right foot): ?Nondisplaced second through fourth metatarsal base fracture ? ?I personally reviewed and interpreted the radiographs. ? ? ?Assessment:   ?71 y.o. female with a concentric ankle in the setting of very small tip fractures of the medial malleolus and lateral malleolus.  She does have nondisplaced second through fourth metatarsal fractures.  To this effect given the fact that these are very distal fractures I do believe this is a stable concentric mortise.  At this time I will plan to make her nonweightbearing given the second through fourth metatarsal base fractures.  Additional 4 weeks.  She will be placed in a cam boot.  I will see her back in 4 weeks and we will plan to progress range of motion at that time.  We will plan to obtain x-rays in 4 weeks of the right ankle and right foot ? ?Plan :   ? ?-Return to clinic in 4 weeks ? ? ? ? ?I personally saw and evaluated the patient, and participated in the management and treatment plan. ? ?[name], MD ?Attending Physician, Orthopedic Surgery ? ?This document was dictated using Systems analyst. A reasonable attempt at proof reading has been made to minimize errors. ?

## 2021-10-18 DIAGNOSIS — W1811XD Fall from or off toilet without subsequent striking against object, subsequent encounter: Secondary | ICD-10-CM | POA: Diagnosis not present

## 2021-10-18 DIAGNOSIS — S99921D Unspecified injury of right foot, subsequent encounter: Secondary | ICD-10-CM | POA: Diagnosis not present

## 2021-10-18 DIAGNOSIS — K219 Gastro-esophageal reflux disease without esophagitis: Secondary | ICD-10-CM | POA: Diagnosis not present

## 2021-10-18 DIAGNOSIS — M1991 Primary osteoarthritis, unspecified site: Secondary | ICD-10-CM | POA: Diagnosis not present

## 2021-10-18 DIAGNOSIS — E039 Hypothyroidism, unspecified: Secondary | ICD-10-CM | POA: Diagnosis not present

## 2021-10-18 DIAGNOSIS — S92301D Fracture of unspecified metatarsal bone(s), right foot, subsequent encounter for fracture with routine healing: Secondary | ICD-10-CM | POA: Diagnosis not present

## 2021-10-21 DIAGNOSIS — E038 Other specified hypothyroidism: Secondary | ICD-10-CM | POA: Diagnosis not present

## 2021-10-21 DIAGNOSIS — K219 Gastro-esophageal reflux disease without esophagitis: Secondary | ICD-10-CM | POA: Diagnosis not present

## 2021-10-21 DIAGNOSIS — K5904 Chronic idiopathic constipation: Secondary | ICD-10-CM | POA: Diagnosis not present

## 2021-10-21 DIAGNOSIS — S92301D Fracture of unspecified metatarsal bone(s), right foot, subsequent encounter for fracture with routine healing: Secondary | ICD-10-CM | POA: Diagnosis not present

## 2021-10-21 DIAGNOSIS — S99921D Unspecified injury of right foot, subsequent encounter: Secondary | ICD-10-CM | POA: Diagnosis not present

## 2021-10-21 DIAGNOSIS — M1991 Primary osteoarthritis, unspecified site: Secondary | ICD-10-CM | POA: Diagnosis not present

## 2021-10-21 DIAGNOSIS — E039 Hypothyroidism, unspecified: Secondary | ICD-10-CM | POA: Diagnosis not present

## 2021-10-21 DIAGNOSIS — R296 Repeated falls: Secondary | ICD-10-CM | POA: Diagnosis not present

## 2021-10-25 DIAGNOSIS — E039 Hypothyroidism, unspecified: Secondary | ICD-10-CM | POA: Diagnosis not present

## 2021-10-25 DIAGNOSIS — K219 Gastro-esophageal reflux disease without esophagitis: Secondary | ICD-10-CM | POA: Diagnosis not present

## 2021-10-25 DIAGNOSIS — S92301D Fracture of unspecified metatarsal bone(s), right foot, subsequent encounter for fracture with routine healing: Secondary | ICD-10-CM | POA: Diagnosis not present

## 2021-10-25 DIAGNOSIS — E038 Other specified hypothyroidism: Secondary | ICD-10-CM | POA: Diagnosis not present

## 2021-10-25 DIAGNOSIS — S99921D Unspecified injury of right foot, subsequent encounter: Secondary | ICD-10-CM | POA: Diagnosis not present

## 2021-10-25 DIAGNOSIS — K5904 Chronic idiopathic constipation: Secondary | ICD-10-CM | POA: Diagnosis not present

## 2021-10-25 DIAGNOSIS — M1991 Primary osteoarthritis, unspecified site: Secondary | ICD-10-CM | POA: Diagnosis not present

## 2021-10-31 ENCOUNTER — Telehealth: Payer: Self-pay | Admitting: Primary Care

## 2021-10-31 NOTE — Telephone Encounter (Signed)
Error message

## 2021-11-02 DIAGNOSIS — S82841D Displaced bimalleolar fracture of right lower leg, subsequent encounter for closed fracture with routine healing: Secondary | ICD-10-CM | POA: Diagnosis not present

## 2021-11-02 DIAGNOSIS — Z79891 Long term (current) use of opiate analgesic: Secondary | ICD-10-CM | POA: Diagnosis not present

## 2021-11-02 DIAGNOSIS — S92301D Fracture of unspecified metatarsal bone(s), right foot, subsequent encounter for fracture with routine healing: Secondary | ICD-10-CM | POA: Diagnosis not present

## 2021-11-02 DIAGNOSIS — M1991 Primary osteoarthritis, unspecified site: Secondary | ICD-10-CM | POA: Diagnosis not present

## 2021-11-02 DIAGNOSIS — E039 Hypothyroidism, unspecified: Secondary | ICD-10-CM | POA: Diagnosis not present

## 2021-11-02 DIAGNOSIS — K219 Gastro-esophageal reflux disease without esophagitis: Secondary | ICD-10-CM | POA: Diagnosis not present

## 2021-11-02 DIAGNOSIS — K5909 Other constipation: Secondary | ICD-10-CM | POA: Diagnosis not present

## 2021-11-02 DIAGNOSIS — Z9181 History of falling: Secondary | ICD-10-CM | POA: Diagnosis not present

## 2021-11-06 ENCOUNTER — Telehealth: Payer: Self-pay | Admitting: Orthopaedic Surgery

## 2021-11-06 DIAGNOSIS — E039 Hypothyroidism, unspecified: Secondary | ICD-10-CM | POA: Diagnosis not present

## 2021-11-06 DIAGNOSIS — K219 Gastro-esophageal reflux disease without esophagitis: Secondary | ICD-10-CM | POA: Diagnosis not present

## 2021-11-06 DIAGNOSIS — K5909 Other constipation: Secondary | ICD-10-CM | POA: Diagnosis not present

## 2021-11-06 DIAGNOSIS — S82841D Displaced bimalleolar fracture of right lower leg, subsequent encounter for closed fracture with routine healing: Secondary | ICD-10-CM | POA: Diagnosis not present

## 2021-11-06 DIAGNOSIS — S92301D Fracture of unspecified metatarsal bone(s), right foot, subsequent encounter for fracture with routine healing: Secondary | ICD-10-CM | POA: Diagnosis not present

## 2021-11-06 DIAGNOSIS — M1991 Primary osteoarthritis, unspecified site: Secondary | ICD-10-CM | POA: Diagnosis not present

## 2021-11-06 NOTE — Telephone Encounter (Signed)
Home health called and pt is refusing home health therapy. She is full WB in her boot.   CB [phone]

## 2021-11-13 ENCOUNTER — Ambulatory Visit (HOSPITAL_BASED_OUTPATIENT_CLINIC_OR_DEPARTMENT_OTHER): Payer: Medicare Other | Admitting: Orthopaedic Surgery

## 2021-11-14 DIAGNOSIS — E039 Hypothyroidism, unspecified: Secondary | ICD-10-CM | POA: Diagnosis not present

## 2021-11-14 DIAGNOSIS — K5909 Other constipation: Secondary | ICD-10-CM | POA: Diagnosis not present

## 2021-11-14 DIAGNOSIS — S82841D Displaced bimalleolar fracture of right lower leg, subsequent encounter for closed fracture with routine healing: Secondary | ICD-10-CM | POA: Diagnosis not present

## 2021-11-14 DIAGNOSIS — S92301D Fracture of unspecified metatarsal bone(s), right foot, subsequent encounter for fracture with routine healing: Secondary | ICD-10-CM | POA: Diagnosis not present

## 2021-11-14 DIAGNOSIS — K219 Gastro-esophageal reflux disease without esophagitis: Secondary | ICD-10-CM | POA: Diagnosis not present

## 2021-11-14 DIAGNOSIS — M1991 Primary osteoarthritis, unspecified site: Secondary | ICD-10-CM | POA: Diagnosis not present

## 2021-11-22 ENCOUNTER — Ambulatory Visit (INDEPENDENT_AMBULATORY_CARE_PROVIDER_SITE_OTHER): Payer: Medicare Other | Admitting: Orthopaedic Surgery

## 2021-11-22 ENCOUNTER — Ambulatory Visit (HOSPITAL_BASED_OUTPATIENT_CLINIC_OR_DEPARTMENT_OTHER): Payer: Medicare Other

## 2021-11-22 DIAGNOSIS — S8251XD Displaced fracture of medial malleolus of right tibia, subsequent encounter for closed fracture with routine healing: Secondary | ICD-10-CM | POA: Diagnosis not present

## 2021-11-22 NOTE — Progress Notes (Signed)
Chief Complaint: Right ankle fracture     History of Present Illness:   11/22/2021: Presents today for follow-up of the right ankle fracture.  She has been in a cam boot.  She has been putting weight on this with the use of a walker.  Overall she does not have any pain at the base of the foot at this time.   Madison Calhoun is a 71 y.o. female presents today for follow-up status post right ankle dislocation in her bathroom.  This happened 1 week prior.  She had self reduced this and presented to the emergency room.  CT scan was obtained which showed bimalleolar ankle fracture as well as second through fourth metatarsal fractures of the base.  She denies any previous issues with her ankle.  She has been in a nursing home nonweightbearing in a splint.  She is here today for further assessment.  She denies any history of diabetes    Surgical History:   None  PMH/PSH/Family History/Social History/Meds/Allergies:    Past Medical History:  Diagnosis Date   Arthritis    Chicken pox    Glaucoma    Thyroid disease    Past Surgical History:  Procedure Laterality Date   BUNIONECTOMY     CATARACT EXTRACTION W/ INTRAOCULAR LENS IMPLANT Bilateral    GALLBLADDER SURGERY  1990   GLAUCOMA SURGERY     SPINE SURGERY     Social History   Socioeconomic History   Marital status: Legally Separated    Spouse name: Not on file   Number of children: Not on file   Years of education: Not on file   Highest education level: Not on file  Occupational History   Not on file  Tobacco Use   Smoking status: Never   Smokeless tobacco: Not on file  Substance and Sexual Activity   Alcohol use: No    Alcohol/week: 0.0 standard drinks of alcohol   Drug use: No   Sexual activity: Not on file  Other Topics Concern   Not on file  Social History Narrative   Single.   Twin children.   Retired. Worked as a Dealer.   Enjoys playing computer games, plays  with her dogs.   Social Determinants of Health   Financial Resource Strain: Not on file  Food Insecurity: Not on file  Transportation Needs: Not on file  Physical Activity: Not on file  Stress: Not on file  Social Connections: Not on file   Family History  Problem Relation Age of Onset   Alcohol abuse Mother    Cancer Mother        Uterine   Heart disease Mother    Mental illness Mother        Alzheimers   Hyperlipidemia Mother    Hypertension Mother    Kidney disease Mother    Alcohol abuse Father    Heart disease Father    Heart disease Brother    Arthritis Brother    Hyperlipidemia Brother    Hypertension Brother    Kidney disease Brother    Heart disease Brother    Hyperlipidemia Brother    Hypertension Brother    No Known Allergies Current Outpatient Medications  Medication Sig Dispense Refill   docusate sodium (COLACE) 100 MG capsule Take 1 capsule (100 mg  total) by mouth every 12 (twelve) hours. 60 capsule 0   HYDROcodone-acetaminophen (NORCO) 5-325 MG tablet Take 1 tablet by mouth every 4 (four) hours as needed for severe pain. 20 tablet 0   levothyroxine (LEVOTHROID) 25 MCG tablet Take 1 tablet (25 mcg total) by mouth daily before breakfast. (Patient not taking: Reported on 10/07/2021) 30 tablet 5   naproxen sodium (ALEVE) 220 MG tablet Take 440 mg by mouth 2 (two) times daily as needed (foot pain).     ranitidine (ZANTAC) 150 MG tablet Take 1 tablet (150 mg total) by mouth daily. (Patient not taking: Reported on 10/07/2021) 30 tablet 2   No current facility-administered medications for this visit.   No results found.  Review of Systems:   A ROS was performed including pertinent positives and negatives as documented in the HPI.  Physical Exam :   Constitutional: NAD and appears stated age Neurological: Alert and oriented Psych: Appropriate affect and cooperative There were no vitals taken for this visit.   Comprehensive Musculoskeletal Exam:    Tenderness  palpation about the right ankle medial lateral malleolus.  2+ dorsalis pedis pulse.  She is able to fire EHL as well as tibialis anterior  Imaging:   Xray (3 views right ankle): Concentric ankle joint with a very small fibular and medial malleolar avulsion type fracture  CT (right foot): Nondisplaced second through fourth metatarsal base fracture  I personally reviewed and interpreted the radiographs.   Assessment:   71 y.o. female with a concentric ankle in the setting of very small tip fractures of the medial malleolus and lateral malleolus.  Overall today's visit she does not have any pain about the ankle or the midfoot.  At this time I will plan to make her weightbearing as tolerated in normal shoe.  She may wean off the walker as tolerated.  I will see her back in 8 weeks for final check  Plan :    -Return to clinic in 8 weeks     I personally saw and evaluated the patient, and participated in the management and treatment plan.  [name], MD Attending Physician, Orthopedic Surgery  This document was dictated using Dragon voice recognition software. A reasonable attempt at proof reading has been made to minimize errors.

## 2022-01-24 ENCOUNTER — Ambulatory Visit (INDEPENDENT_AMBULATORY_CARE_PROVIDER_SITE_OTHER): Payer: Medicare Other | Admitting: Orthopaedic Surgery

## 2022-01-24 ENCOUNTER — Ambulatory Visit (INDEPENDENT_AMBULATORY_CARE_PROVIDER_SITE_OTHER): Payer: Medicare Other

## 2022-01-24 DIAGNOSIS — S8251XD Displaced fracture of medial malleolus of right tibia, subsequent encounter for closed fracture with routine healing: Secondary | ICD-10-CM

## 2022-01-24 DIAGNOSIS — S8261XA Displaced fracture of lateral malleolus of right fibula, initial encounter for closed fracture: Secondary | ICD-10-CM | POA: Diagnosis not present

## 2022-01-24 NOTE — Progress Notes (Addendum)
Chief Complaint: Right ankle fracture     History of Present Illness:   01/24/2022: Presents today for follow-up of her right ankle.  Overall she is still feeling much better.  She will occasionally have some pain about the lateral aspect of the ankle when she bumps it on something.  She is walking essentially pain-free  Madison Calhoun is a 71 y.o. female presents today for follow-up status post right ankle dislocation in her bathroom.  This happened 1 week prior.  She had self reduced this and presented to the emergency room.  CT scan was obtained which showed bimalleolar ankle fracture as well as second through fourth metatarsal fractures of the base.  She denies any previous issues with her ankle.  She has been in a nursing home nonweightbearing in a splint.  She is here today for further assessment.  She denies any history of diabetes    Surgical History:   None  PMH/PSH/Family History/Social History/Meds/Allergies:    Past Medical History:  Diagnosis Date   Arthritis    Chicken pox    Glaucoma    Thyroid disease    Past Surgical History:  Procedure Laterality Date   BUNIONECTOMY     CATARACT EXTRACTION W/ INTRAOCULAR LENS IMPLANT Bilateral    GALLBLADDER SURGERY  1990   GLAUCOMA SURGERY     SPINE SURGERY     Social History   Socioeconomic History   Marital status: Legally Separated    Spouse name: Not on file   Number of children: Not on file   Years of education: Not on file   Highest education level: Not on file  Occupational History   Not on file  Tobacco Use   Smoking status: Never   Smokeless tobacco: Not on file  Substance and Sexual Activity   Alcohol use: No    Alcohol/week: 0.0 standard drinks of alcohol   Drug use: No   Sexual activity: Not on file  Other Topics Concern   Not on file  Social History Narrative   Single.   Twin children.   Retired. Worked as a Dealer.   Enjoys playing computer  games, plays with her dogs.   Social Determinants of Health   Financial Resource Strain: Not on file  Food Insecurity: Not on file  Transportation Needs: Not on file  Physical Activity: Not on file  Stress: Not on file  Social Connections: Not on file   Family History  Problem Relation Age of Onset   Alcohol abuse Mother    Cancer Mother        Uterine   Heart disease Mother    Mental illness Mother        Alzheimers   Hyperlipidemia Mother    Hypertension Mother    Kidney disease Mother    Alcohol abuse Father    Heart disease Father    Heart disease Brother    Arthritis Brother    Hyperlipidemia Brother    Hypertension Brother    Kidney disease Brother    Heart disease Brother    Hyperlipidemia Brother    Hypertension Brother    No Known Allergies Current Outpatient Medications  Medication Sig Dispense Refill   docusate sodium (COLACE) 100 MG capsule Take 1 capsule (100 mg total) by mouth every 12 (twelve) hours.  60 capsule 0   HYDROcodone-acetaminophen (NORCO) 5-325 MG tablet Take 1 tablet by mouth every 4 (four) hours as needed for severe pain. 20 tablet 0   levothyroxine (LEVOTHROID) 25 MCG tablet Take 1 tablet (25 mcg total) by mouth daily before breakfast. (Patient not taking: Reported on 10/07/2021) 30 tablet 5   naproxen sodium (ALEVE) 220 MG tablet Take 440 mg by mouth 2 (two) times daily as needed (foot pain).     ranitidine (ZANTAC) 150 MG tablet Take 1 tablet (150 mg total) by mouth daily. (Patient not taking: Reported on 10/07/2021) 30 tablet 2   No current facility-administered medications for this visit.   No results found.  Review of Systems:   A ROS was performed including pertinent positives and negatives as documented in the HPI.  Physical Exam :   Constitutional: NAD and appears stated age Neurological: Alert and oriented Psych: Appropriate affect and cooperative There were no vitals taken for this visit.   Comprehensive Musculoskeletal Exam:     Tenderness palpation about the right ankle medial lateral malleolus.  2+ dorsalis pedis pulse.  She is able to fire EHL as well as tibialis anterior  Imaging:   Xray (3 views right ankle): Concentric ankle joint with a very small fibular and medial malleolar avulsion type fracture  CT (right foot): Nondisplaced second through fourth metatarsal base fracture  I personally reviewed and interpreted the radiographs.   Assessment:   71 y.o. female with a concentric ankle in the setting of very small tip fractures of the medial malleolus and lateral malleolus.  Overall she is doing very well at today's visit.  There is some irritation about the lateral aspect of the ankle when she has bumped on something. She was previously ambulator prior to this injury.  She will plan to see me back on an as-needed basis  Plan :    -Return to clinic as needed    I personally saw and evaluated the patient, and participated in the management and treatment plan.  [name], MD Attending Physician, Orthopedic Surgery  This document was dictated using Dragon voice recognition software. A reasonable attempt at proof reading has been made to minimize errors.

## 2022-03-26 ENCOUNTER — Emergency Department (HOSPITAL_BASED_OUTPATIENT_CLINIC_OR_DEPARTMENT_OTHER): Payer: Medicare Other | Admitting: Radiology

## 2022-03-26 ENCOUNTER — Encounter (HOSPITAL_BASED_OUTPATIENT_CLINIC_OR_DEPARTMENT_OTHER): Payer: Self-pay | Admitting: Emergency Medicine

## 2022-03-26 ENCOUNTER — Inpatient Hospital Stay (HOSPITAL_BASED_OUTPATIENT_CLINIC_OR_DEPARTMENT_OTHER)
Admission: EM | Admit: 2022-03-26 | Discharge: 2022-03-29 | DRG: 322 | Disposition: A | Discharge status: Home / Self Care | Payer: Medicare Other | Attending: Cardiovascular Disease | Admitting: Cardiovascular Disease

## 2022-03-26 ENCOUNTER — Other Ambulatory Visit: Payer: Self-pay

## 2022-03-26 DIAGNOSIS — Z9842 Cataract extraction status, left eye: Secondary | ICD-10-CM | POA: Diagnosis not present

## 2022-03-26 DIAGNOSIS — Z79890 Hormone replacement therapy (postmenopausal): Secondary | ICD-10-CM

## 2022-03-26 DIAGNOSIS — Z6841 Body Mass Index (BMI) 40.0 and over, adult: Secondary | ICD-10-CM | POA: Diagnosis not present

## 2022-03-26 DIAGNOSIS — Z87891 Personal history of nicotine dependence: Secondary | ICD-10-CM | POA: Diagnosis not present

## 2022-03-26 DIAGNOSIS — I255 Ischemic cardiomyopathy: Secondary | ICD-10-CM | POA: Diagnosis present

## 2022-03-26 DIAGNOSIS — I214 Non-ST elevation (NSTEMI) myocardial infarction: Secondary | ICD-10-CM | POA: Diagnosis not present

## 2022-03-26 DIAGNOSIS — Z79899 Other long term (current) drug therapy: Secondary | ICD-10-CM

## 2022-03-26 DIAGNOSIS — H409 Unspecified glaucoma: Secondary | ICD-10-CM | POA: Diagnosis present

## 2022-03-26 DIAGNOSIS — R0602 Shortness of breath: Secondary | ICD-10-CM | POA: Diagnosis not present

## 2022-03-26 DIAGNOSIS — Z961 Presence of intraocular lens: Secondary | ICD-10-CM | POA: Diagnosis present

## 2022-03-26 DIAGNOSIS — Z635 Disruption of family by separation and divorce: Secondary | ICD-10-CM | POA: Diagnosis not present

## 2022-03-26 DIAGNOSIS — N179 Acute kidney failure, unspecified: Secondary | ICD-10-CM | POA: Diagnosis not present

## 2022-03-26 DIAGNOSIS — R7303 Prediabetes: Secondary | ICD-10-CM | POA: Diagnosis present

## 2022-03-26 DIAGNOSIS — I2510 Atherosclerotic heart disease of native coronary artery without angina pectoris: Secondary | ICD-10-CM | POA: Diagnosis present

## 2022-03-26 DIAGNOSIS — N183 Chronic kidney disease, stage 3 unspecified: Secondary | ICD-10-CM | POA: Diagnosis not present

## 2022-03-26 DIAGNOSIS — I252 Old myocardial infarction: Secondary | ICD-10-CM | POA: Diagnosis present

## 2022-03-26 DIAGNOSIS — E039 Hypothyroidism, unspecified: Secondary | ICD-10-CM | POA: Diagnosis not present

## 2022-03-26 DIAGNOSIS — Z9841 Cataract extraction status, right eye: Secondary | ICD-10-CM | POA: Diagnosis not present

## 2022-03-26 DIAGNOSIS — R9431 Abnormal electrocardiogram [ECG] [EKG]: Secondary | ICD-10-CM | POA: Diagnosis not present

## 2022-03-26 DIAGNOSIS — Z8249 Family history of ischemic heart disease and other diseases of the circulatory system: Secondary | ICD-10-CM

## 2022-03-26 DIAGNOSIS — R079 Chest pain, unspecified: Secondary | ICD-10-CM | POA: Diagnosis not present

## 2022-03-26 DIAGNOSIS — I1310 Hypertensive heart and chronic kidney disease without heart failure, with stage 1 through stage 4 chronic kidney disease, or unspecified chronic kidney disease: Secondary | ICD-10-CM | POA: Diagnosis not present

## 2022-03-26 DIAGNOSIS — M546 Pain in thoracic spine: Secondary | ICD-10-CM | POA: Diagnosis present

## 2022-03-26 DIAGNOSIS — Z955 Presence of coronary angioplasty implant and graft: Secondary | ICD-10-CM

## 2022-03-26 DIAGNOSIS — E785 Hyperlipidemia, unspecified: Secondary | ICD-10-CM | POA: Diagnosis not present

## 2022-03-26 MED ORDER — ACETAMINOPHEN 500 MG PO TABS
1000.0000 mg | ORAL_TABLET | Freq: Once | ORAL | Status: AC
  Administered 2022-03-26: 1000 mg via ORAL
  Filled 2022-03-26: qty 2

## 2022-03-26 NOTE — ED Provider Notes (Signed)
[facility] EMERGENCY DEPT Provider Note   CSN: [id number] Arrival date & time: 03/26/22  1929     History {Add pertinent medical, surgical, social history, OB history to HPI:1} Chief Complaint  Patient presents with   Back Pain    Madison Calhoun is a 71 y.o. female.  71 yo F with a chief complaints of midthoracic back pain.  This started suddenly while she was at [employer].  She said it lasted for about an hour.  She has been having symptoms like this off and on for like 40 years or so.  Initially started when she had something to eat.  Resolved with ibuprofen in the past.  She denies trauma.  Denies any worsening with certain positions movement or twisting.  She feels like it is in between both her shoulder blades.  Denies any anterior pain.  Had some shortness of breath with this when it occurred.  Is feeling a bit better now.   Back Pain      Home Medications Prior to Admission medications   Medication Sig Start Date End Date Taking? Authorizing Provider  docusate sodium (COLACE) 100 MG capsule Take 1 capsule (100 mg total) by mouth every 12 (twelve) hours. 10/10/21   Sherwood Gambler, MD  HYDROcodone-acetaminophen (NORCO) 5-325 MG tablet Take 1 tablet by mouth every 4 (four) hours as needed for severe pain. 10/10/21   Sherwood Gambler, MD  levothyroxine (LEVOTHROID) 25 MCG tablet Take 1 tablet (25 mcg total) by mouth daily before breakfast. Patient not taking: Reported on 10/07/2021 10/31/14   Pleas Koch, NP  naproxen sodium (ALEVE) 220 MG tablet Take 440 mg by mouth 2 (two) times daily as needed (foot pain).    [provider]  ranitidine (ZANTAC) 150 MG tablet Take 1 tablet (150 mg total) by mouth daily. Patient not taking: Reported on 10/07/2021 10/30/14   Pleas Koch, NP      Allergies    Patient has no known allergies.    Review of Systems   Review of Systems  Musculoskeletal:  Positive for back pain.    Physical Exam Updated Vital  Signs BP (!) 162/98 (BP Location: Right Arm)   Pulse 86   Temp (!) 97.5 F (36.4 C)   Resp (!) 22   SpO2 98%  Physical Exam Vitals and nursing note reviewed.  Constitutional:      General: She is not in acute distress.    Appearance: She is well-developed. She is not diaphoretic.  HENT:     Head: Normocephalic and atraumatic.  Eyes:     Pupils: Pupils are equal, round, and reactive to light.  Cardiovascular:     Rate and Rhythm: Normal rate and regular rhythm.     Heart sounds: No murmur heard.    No friction rub. No gallop.  Pulmonary:     Effort: Pulmonary effort is normal.     Breath sounds: No wheezing or rales.  Abdominal:     General: There is no distension.     Palpations: Abdomen is soft.     Tenderness: There is no abdominal tenderness.  Musculoskeletal:        General: No tenderness.     Cervical back: Normal range of motion and neck supple.     Comments: She points in between her scapula.  I do not appreciate any obvious areas of reproducible pain.  No midline spinal tenderness step-offs or deformities.  Skin:    General: Skin is warm and  dry.  Neurological:     Mental Status: She is alert and oriented to person, place, and time.  Psychiatric:        Behavior: Behavior normal.     ED Results / Procedures / Treatments   Labs (all labs ordered are listed, but only abnormal results are displayed) Labs Reviewed  CBC WITH DIFFERENTIAL/PLATELET  BASIC METABOLIC PANEL  TROPONIN I (HIGH SENSITIVITY)    EKG None  Radiology No results found.  Procedures Procedures  {Document cardiac monitor, telemetry assessment procedure when appropriate:1}  Medications Ordered in ED Medications  acetaminophen (TYLENOL) tablet 1,000 mg (has no administration in time range)    ED Course/ Medical Decision Making/ A&P                           Medical Decision Making Amount and/or Complexity of Data Reviewed Labs: ordered. Radiology: ordered. ECG/medicine tests:  ordered.  Risk OTC drugs.   71 yo F with a chief complaints of midthoracic back pain.  She has had problems with this off and on for some 40 years or so.  I do not see any obvious visits with this problem in the past on my record review.  Patient's symptoms could be an atypical presentation of MI based on the history that have obtained from her.  Not obviously musculoskeletal by history or physical exam.  We will obtain a chest x-ray blood work.  Reassess.  {Document critical care time when appropriate:1} {Document review of labs and clinical decision tools ie heart score, Chads2Vasc2 etc:1}  {Document your independent review of radiology images, and any outside records:1} {Document your discussion with family members, caretakers, and with consultants:1} {Document social determinants of health affecting pt's care:1} {Document your decision making why or why not admission, treatments were needed:1} Final Clinical Impression(s) / ED Diagnoses Final diagnoses:  None    Rx / DC Orders ED Discharge Orders     None

## 2022-03-26 NOTE — ED Notes (Signed)
Pt states she has had this pain for years Mainly between shoulder blades, denies n/v or abdominal pain OTC meds ineffective

## 2022-03-26 NOTE — ED Triage Notes (Addendum)
Upper Back/ shoulder pain  Started in 1987 Worse today. OTC pain reliever not effective today. Denies any new injury. No distress in triage

## 2022-03-27 ENCOUNTER — Other Ambulatory Visit (HOSPITAL_COMMUNITY): Payer: Medicare Other

## 2022-03-27 DIAGNOSIS — Z87891 Personal history of nicotine dependence: Secondary | ICD-10-CM | POA: Diagnosis not present

## 2022-03-27 DIAGNOSIS — I214 Non-ST elevation (NSTEMI) myocardial infarction: Secondary | ICD-10-CM | POA: Diagnosis present

## 2022-03-27 DIAGNOSIS — Z8249 Family history of ischemic heart disease and other diseases of the circulatory system: Secondary | ICD-10-CM | POA: Diagnosis not present

## 2022-03-27 DIAGNOSIS — Z6841 Body Mass Index (BMI) 40.0 and over, adult: Secondary | ICD-10-CM | POA: Diagnosis not present

## 2022-03-27 DIAGNOSIS — Z79890 Hormone replacement therapy (postmenopausal): Secondary | ICD-10-CM | POA: Diagnosis not present

## 2022-03-27 DIAGNOSIS — I252 Old myocardial infarction: Secondary | ICD-10-CM | POA: Diagnosis present

## 2022-03-27 DIAGNOSIS — I255 Ischemic cardiomyopathy: Secondary | ICD-10-CM | POA: Diagnosis present

## 2022-03-27 DIAGNOSIS — Z9841 Cataract extraction status, right eye: Secondary | ICD-10-CM | POA: Diagnosis not present

## 2022-03-27 DIAGNOSIS — N183 Chronic kidney disease, stage 3 unspecified: Secondary | ICD-10-CM | POA: Diagnosis present

## 2022-03-27 DIAGNOSIS — Z9842 Cataract extraction status, left eye: Secondary | ICD-10-CM | POA: Diagnosis not present

## 2022-03-27 DIAGNOSIS — R079 Chest pain, unspecified: Secondary | ICD-10-CM | POA: Diagnosis not present

## 2022-03-27 DIAGNOSIS — E039 Hypothyroidism, unspecified: Secondary | ICD-10-CM | POA: Diagnosis present

## 2022-03-27 DIAGNOSIS — H409 Unspecified glaucoma: Secondary | ICD-10-CM | POA: Diagnosis present

## 2022-03-27 DIAGNOSIS — E785 Hyperlipidemia, unspecified: Secondary | ICD-10-CM | POA: Diagnosis present

## 2022-03-27 DIAGNOSIS — I1310 Hypertensive heart and chronic kidney disease without heart failure, with stage 1 through stage 4 chronic kidney disease, or unspecified chronic kidney disease: Secondary | ICD-10-CM | POA: Diagnosis present

## 2022-03-27 DIAGNOSIS — R0602 Shortness of breath: Secondary | ICD-10-CM | POA: Diagnosis not present

## 2022-03-27 DIAGNOSIS — N179 Acute kidney failure, unspecified: Secondary | ICD-10-CM | POA: Diagnosis present

## 2022-03-27 DIAGNOSIS — I2510 Atherosclerotic heart disease of native coronary artery without angina pectoris: Secondary | ICD-10-CM | POA: Diagnosis present

## 2022-03-27 DIAGNOSIS — Z961 Presence of intraocular lens: Secondary | ICD-10-CM | POA: Diagnosis present

## 2022-03-27 DIAGNOSIS — Z79899 Other long term (current) drug therapy: Secondary | ICD-10-CM | POA: Diagnosis not present

## 2022-03-27 DIAGNOSIS — R9431 Abnormal electrocardiogram [ECG] [EKG]: Secondary | ICD-10-CM | POA: Diagnosis not present

## 2022-03-27 DIAGNOSIS — Z635 Disruption of family by separation and divorce: Secondary | ICD-10-CM | POA: Diagnosis not present

## 2022-03-27 DIAGNOSIS — R7303 Prediabetes: Secondary | ICD-10-CM | POA: Diagnosis present

## 2022-03-27 DIAGNOSIS — M546 Pain in thoracic spine: Secondary | ICD-10-CM | POA: Diagnosis present

## 2022-03-27 HISTORY — DX: Non-ST elevation (NSTEMI) myocardial infarction: I21.4

## 2022-03-27 LAB — HEPARIN LEVEL (UNFRACTIONATED)
Heparin Unfractionated: 0.4 IU/mL (ref 0.30–0.70)
Heparin Unfractionated: 0.3 IU/mL (ref 0.30–0.70)

## 2022-03-27 LAB — TROPONIN I (HIGH SENSITIVITY)
Troponin I (High Sensitivity): 665 ng/L (ref <18)
Troponin I (High Sensitivity): 694 ng/L (ref <18)
Troponin I (High Sensitivity): 764 ng/L (ref <18)
Troponin I (High Sensitivity): 805 ng/L (ref <18)

## 2022-03-27 LAB — CBC WITH DIFFERENTIAL/PLATELET
Abs Immature Granulocytes: 0.02 10*3/uL (ref 0.00–0.07)
Basophils Absolute: 0 10*3/uL (ref 0.0–0.1)
Basophils Relative: 1 %
Eosinophils Absolute: 0.1 10*3/uL (ref 0.0–0.5)
Eosinophils Relative: 1 %
HCT: 43.1 % (ref 36.0–46.0)
Hemoglobin: 13.9 g/dL (ref 12.0–15.0)
Immature Granulocytes: 0 %
Lymphocytes Relative: 15 %
Lymphs Abs: 1.3 10*3/uL (ref 0.7–4.0)
MCH: 29.7 pg (ref 26.0–34.0)
MCHC: 32.3 g/dL (ref 30.0–36.0)
MCV: 92.1 fL (ref 80.0–100.0)
Monocytes Absolute: 0.6 10*3/uL (ref 0.1–1.0)
Monocytes Relative: 8 %
Neutro Abs: 6.2 10*3/uL (ref 1.7–7.7)
Neutrophils Relative %: 75 %
Platelets: 193 10*3/uL (ref 150–400)
RBC: 4.68 MIL/uL (ref 3.87–5.11)
RDW: 12.7 % (ref 11.5–15.5)
WBC: 8.3 10*3/uL (ref 4.0–10.5)
nRBC: 0 % (ref 0.0–0.2)

## 2022-03-27 LAB — BASIC METABOLIC PANEL
Anion gap: 9 (ref 5–15)
BUN: 21 mg/dL (ref 8–23)
CO2: 23 mmol/L (ref 22–32)
Calcium: 9.4 mg/dL (ref 8.9–10.3)
Chloride: 109 mmol/L (ref 98–111)
Creatinine, Ser: 1.47 mg/dL — ABNORMAL HIGH (ref 0.44–1.00)
GFR, Estimated: 38 mL/min — ABNORMAL LOW (ref >60)
Glucose, Bld: 113 mg/dL — ABNORMAL HIGH (ref 70–99)
Potassium: 4.2 mmol/L (ref 3.5–5.1)
Sodium: 141 mmol/L (ref 135–145)

## 2022-03-27 LAB — HEMOGLOBIN A1C
Hgb A1c MFr Bld: 5.9 % — ABNORMAL HIGH (ref 4.8–5.6)
Mean Plasma Glucose: 122.63 mg/dL

## 2022-03-27 LAB — D-DIMER, QUANTITATIVE: D-Dimer, Quant: 1.35 ug/mL-FEU — ABNORMAL HIGH (ref 0.00–0.50)

## 2022-03-27 LAB — TSH: TSH: 3.565 u[IU]/mL (ref 0.350–4.500)

## 2022-03-27 MED ORDER — ONDANSETRON HCL 4 MG/2ML IJ SOLN: 4.0000 mg | Freq: Four times a day (QID) | INTRAMUSCULAR | Status: DC | PRN

## 2022-03-27 MED ORDER — HEPARIN (PORCINE) 25000 UT/250ML-% IV SOLN
1250.0000 [IU]/h | INTRAVENOUS | Status: DC
  Administered 2022-03-27 (×2): 1200 [IU]/h via INTRAVENOUS
  Filled 2022-03-27 (×2): qty 250

## 2022-03-27 MED ORDER — CARVEDILOL 6.25 MG PO TABS
6.2500 mg | ORAL_TABLET | Freq: Two times a day (BID) | ORAL | Status: DC
  Administered 2022-03-27 – 2022-03-29 (×4): 6.25 mg via ORAL
  Filled 2022-03-27: qty 1
  Filled 2022-03-27 (×2): qty 2
  Filled 2022-03-27: qty 1

## 2022-03-27 MED ORDER — HEPARIN BOLUS VIA INFUSION
4000.0000 [IU] | Freq: Once | INTRAVENOUS | Status: AC
  Administered 2022-03-27: 4000 [IU] via INTRAVENOUS

## 2022-03-27 MED ORDER — NITROGLYCERIN 0.4 MG SL SUBL: 0.4000 mg | SUBLINGUAL_TABLET | SUBLINGUAL | Status: DC | PRN

## 2022-03-27 MED ORDER — ALBUTEROL (5 MG/ML) CONTINUOUS INHALATION SOLN
INHALATION_SOLUTION | RESPIRATORY_TRACT | Status: AC
  Filled 2022-03-27: qty 20

## 2022-03-27 MED ORDER — ATORVASTATIN CALCIUM 40 MG PO TABS
40.0000 mg | ORAL_TABLET | Freq: Every evening | ORAL | Status: DC
  Administered 2022-03-27 – 2022-03-28 (×2): 40 mg via ORAL
  Filled 2022-03-27 (×2): qty 1

## 2022-03-27 MED ORDER — ACETAMINOPHEN 325 MG PO TABS
650.0000 mg | ORAL_TABLET | Freq: Four times a day (QID) | ORAL | Status: DC | PRN
  Administered 2022-03-27 – 2022-03-28 (×2): 650 mg via ORAL
  Filled 2022-03-27 (×2): qty 2

## 2022-03-27 MED ORDER — ASPIRIN 81 MG PO TBEC
81.0000 mg | DELAYED_RELEASE_TABLET | Freq: Every day | ORAL | Status: DC
  Administered 2022-03-28 – 2022-03-29 (×2): 81 mg via ORAL
  Filled 2022-03-27 (×2): qty 1

## 2022-03-27 MED ORDER — ASPIRIN 81 MG PO CHEW
324.0000 mg | CHEWABLE_TABLET | Freq: Once | ORAL | Status: AC
  Administered 2022-03-27: 324 mg via ORAL
  Filled 2022-03-27: qty 4

## 2022-03-27 NOTE — ED Notes (Signed)
CRITICAL VALUE STICKER  CRITICAL VALUE: Troponin 665ng/L  RECEIVER (on-site recipient of call):Jd Mccaster Jovi Zavadil, RN  DATE & TIME NOTIFIED: 03/27/22 0045    MD NOTIFIED: Dr. Tyrone Nine  TIME OF NOTIFICATION: 9290  RESPONSE:  ASA, repeat EKG, 2nd IV, consult cards

## 2022-03-27 NOTE — ED Notes (Signed)
PLACE  P[facility] -[facility] ON.PATIENT ON [facility]

## 2022-03-27 NOTE — ED Notes (Signed)
Handoff report given to [place] Writer at [facility] ER

## 2022-03-27 NOTE — Progress Notes (Signed)
ANTICOAGULATION CONSULT NOTE - Initial Consult  Pharmacy Consult for Heparin Indication: chest pain/ACS  No Known Allergies  Patient Measurements: Weight: 111.1 kg (245 lb) Heparin Dosing Weight: 80 kg  Vital Signs: Temp: 98.6 F (37 C) (10/19 0106) Temp Source: Oral (10/19 0106) BP: 104/93 (10/19 0100) Pulse Rate: 94 (10/19 0100)  Labs: Recent Labs    03/27/22 0000  HGB 13.9  HCT 43.1  PLT 193  CREATININE 1.47*  TROPONINIHS 665*    Estimated Creatinine Clearance: 42.2 mL/min (A) (by C-G formula based on SCr of 1.47 mg/dL (H)).   Medical History: Past Medical History:  Diagnosis Date   Arthritis    Chicken pox    Glaucoma    Thyroid disease     Medications:  No current facility-administered medications on file prior to encounter.   Current Outpatient Medications on File Prior to Encounter  Medication Sig Dispense Refill   docusate sodium (COLACE) 100 MG capsule Take 1 capsule (100 mg total) by mouth every 12 (twelve) hours. 60 capsule 0   HYDROcodone-acetaminophen (NORCO) 5-325 MG tablet Take 1 tablet by mouth every 4 (four) hours as needed for severe pain. 20 tablet 0   levothyroxine (LEVOTHROID) 25 MCG tablet Take 1 tablet (25 mcg total) by mouth daily before breakfast. (Patient not taking: Reported on 10/07/2021) 30 tablet 5   naproxen sodium (ALEVE) 220 MG tablet Take 440 mg by mouth 2 (two) times daily as needed (foot pain).     ranitidine (ZANTAC) 150 MG tablet Take 1 tablet (150 mg total) by mouth daily. (Patient not taking: Reported on 10/07/2021) 30 tablet 2     Assessment: 71 y.o. female with chest pain and elevated troponin for heparin  Goal of Therapy:  Heparin level 0.3-0.7 units/ml Monitor platelets by anticoagulation protocol: Yes   Plan:  Heparin 4000 units IV bolus, then start heparin 1200 units/hr Check heparin level in 8 hours.   Tonatiuh Mallon, Bronson Curb 03/27/2022,1:10 AM

## 2022-03-27 NOTE — H&P (Signed)
Cardiology Admission History and Physical   Patient ID: Madison Calhoun MRN: [id number]; DOB: [birth year]   Admission date: 03/26/2022  PCP:  Pleas Koch, NP   [facility] Providers Cardiologist:  None        Chief Complaint:  back pain  Patient Profile:   Madison Calhoun is a 71 y.o. female with thyroid disease (not on replacement therapy for many years per patient), arthritis who is being seen 03/27/2022 for the evaluation of mid-thoracic back pain.  History of Present Illness:   Madison Calhoun is a 71 year old female with above-noted medical history who presented to [facility] with complaint of midthoracic back pain.  Patient stated that this pain began suddenly while she was eating at [employer].  Says that this pain radiated up into her neck.  Patient rates pain at 10 out of 10 and describes it as primarily sharp in nature.  She reports mild shortness of breath in association with this back pain.  Denies feeling lightheaded or near syncopal, denies palpitations, denies diaphoresis or nausea.  Apparently patient looked like she felt poorly to others in [employer], so the employees at  [employer] helped her to her car and she drove home.  Patient then spoke with a family member who works in EMS, this family member ultimately drove her to [employer].  Aside from symptoms that prompted a trip to the emergency department, patient says that she is generally felt well in recent months.  Denies symptoms of orthopnea or decreased exertional tolerance (minimally active at baseline).  She does recall having similar pain between her shoulder blades several months ago while she was laying down.  Patient says that the pain improved when she sat up.  Today on my exam patient reports that she has had intermittent recurrence of midthoracic back pain since transferred to [facility] ED from [place].  Patient says that she notices when she lays on her left side the  pain gets worse, and improves when she sits up.  This pain has been moderately responsive to Tylenol that was given.  Patient says that she has not seen a physician in at least 7 years. "I felt fine so I did not go."  It sounds like at some point in the past she was treated for hypothyroidism, but patient says that the last time she took thyroid replacement was in the 1990s.  Patient is a former smoker, by her report has approximately a 5-pack-year history, last smoked over 40 years ago.  Patient does not consume alcohol or use other recreational substances.  Patient lives alone and says that she spends most of her day in bed.   Past Medical History:  Diagnosis Date   Arthritis    Chicken pox    Glaucoma    Thyroid disease     Past Surgical History:  Procedure Laterality Date   BUNIONECTOMY     CATARACT EXTRACTION W/ INTRAOCULAR LENS IMPLANT Bilateral    GALLBLADDER SURGERY  1990   GLAUCOMA SURGERY     SPINE SURGERY       Medications Prior to Admission: Prior to Admission medications   Medication Sig Start Date End Date Taking? Authorizing Provider  docusate sodium (COLACE) 100 MG capsule Take 1 capsule (100 mg total) by mouth every 12 (twelve) hours. 10/10/21   Sherwood Gambler, MD  HYDROcodone-acetaminophen (NORCO) 5-325 MG tablet Take 1 tablet by mouth every 4 (four) hours as needed for severe pain. 10/10/21  Sherwood Gambler, MD  levothyroxine (LEVOTHROID) 25 MCG tablet Take 1 tablet (25 mcg total) by mouth daily before breakfast. Patient not taking: Reported on 10/07/2021 10/31/14   Pleas Koch, NP  naproxen sodium (ALEVE) 220 MG tablet Take 440 mg by mouth 2 (two) times daily as needed (foot pain).    [provider]  ranitidine (ZANTAC) 150 MG tablet Take 1 tablet (150 mg total) by mouth daily. Patient not taking: Reported on 10/07/2021 10/30/14   Pleas Koch, NP     Allergies:   No Known Allergies  Social History:   Social History   Socioeconomic History    Marital status: Legally Separated    Spouse name: Not on file   Number of children: Not on file   Years of education: Not on file   Highest education level: Not on file  Occupational History   Not on file  Tobacco Use   Smoking status: Former    Types: Cigarettes    Quit date: 04/02/1976    Years since quitting: 46.0   Smokeless tobacco: Not on file  Substance and Sexual Activity   Alcohol use: No    Alcohol/week: 0.0 standard drinks of alcohol   Drug use: No   Sexual activity: Not on file  Other Topics Concern   Not on file  Social History Narrative   Single.   Twin children.   Retired. Worked as a Dealer.   Enjoys playing computer games, plays with her dogs.   Social Determinants of Health   Financial Resource Strain: Not on file  Food Insecurity: Not on file  Transportation Needs: Not on file  Physical Activity: Not on file  Stress: Not on file  Social Connections: Not on file  Intimate Partner Violence: Not on file    Family History:   The patient's family history includes Alcohol abuse in her father and mother; Arthritis in her brother; Cancer in her mother; Heart disease in her brother, brother, father, and mother; Hyperlipidemia in her brother, brother, and mother; Hypertension in her brother, brother, and mother; Kidney disease in her brother and mother; Mental illness in her mother.    ROS:  Please see the history of present illness.  All other ROS reviewed and negative.     Physical Exam/Data:   Vitals:   03/27/22 1031 03/27/22 1145 03/27/22 1215 03/27/22 1300  BP: (!) 155/98 (!) 128/98 (!) 148/92 (!) 165/87  Pulse: 85     Resp: (!) 21 (!) 21 (!) 22 15  Temp: 98.2 F (36.8 C)     TempSrc: Oral     SpO2: 96%     Weight:        Intake/Output Summary (Last 24 hours) at 03/27/2022 1327 Last data filed at 03/27/2022 1233 Gross per 24 hour  Intake --  Output 300 ml  Net -300 ml      03/27/2022   12:58 AM 10/30/2014    1:52 PM   Last 3 Weights  Weight (lbs) 245 lb 243 lb 12.8 oz  Weight (kg) 111.131 kg 110.587 kg     Body mass index is 42.05 kg/m.  General:  Well nourished, well developed, in no acute distress HEENT: normal Neck: no JVD Vascular: No carotid bruits; Distal pulses 2+ bilaterally   Cardiac:  normal S1, S2; RRR; no murmur  Lungs:  clear to auscultation bilaterally, no wheezing, rhonchi or rales  Abd: soft, nontender, no hepatomegaly  Ext: trace right lower extremity edema Musculoskeletal:  No deformities, BUE and BLE strength normal and equal Skin: warm and dry  Neuro:  CNs 2-12 intact, no focal abnormalities noted.  Oriented to person, place, time, event.  Does appear to have some underlying confusion regarding her medical history. Psych:  Normal affect    EKG:  The ECG that was done 10/19 was personally reviewed and demonstrates T wave inversions in leads I, AVL  Relevant CV Studies:  N/A  Laboratory Data:  High Sensitivity Troponin:   Recent Labs  Lab 03/27/22 0000 03/27/22 0210 03/27/22 0555 03/27/22 0745  TROPONINIHS 665* 805* 764* 694*      Chemistry Recent Labs  Lab 03/27/22 0000  NA 141  K 4.2  CL 109  CO2 23  GLUCOSE 113*  BUN 21  CREATININE 1.47*  CALCIUM 9.4  GFRNONAA 38*  ANIONGAP 9    No results for input(s): "PROT", "ALBUMIN", "AST", "ALT", "ALKPHOS", "BILITOT" in the last 168 hours. Lipids No results for input(s): "CHOL", "TRIG", "HDL", "LABVLDL", "LDLCALC", "CHOLHDL" in the last 168 hours. Hematology Recent Labs  Lab 03/27/22 0000  WBC 8.3  RBC 4.68  HGB 13.9  HCT 43.1  MCV 92.1  MCH 29.7  MCHC 32.3  RDW 12.7  PLT 193   Thyroid No results for input(s): "TSH", "FREET4" in the last 168 hours. BNPNo results for input(s): "BNP", "PROBNP" in the last 168 hours.  DDimer No results for input(s): "DDIMER" in the last 168 hours.   Radiology/Studies:  DG Chest 2 View  Result Date: 03/26/2022 CLINICAL DATA:  Chest pain. EXAM: CHEST - 2 VIEW  COMPARISON:  None Available. FINDINGS: Upper normal heart size. Aortic atherosclerosis and tortuosity. There are streaky opacities in the right infrahilar lung, possibly localizing to the right middle lobe on lateral view. Mild bronchial thickening. No pleural effusion or pneumothorax. No acute osseous findings. IMPRESSION: 1. Streaky right middle lobe opacities may be atelectasis or pneumonia. Mild bronchial thickening. 2. Upper normal heart size with aortic atherosclerosis and tortuosity. Electronically Signed   By: Keith Rake M.D.   On: 03/26/2022 23:43     Assessment and Plan:   NSTEMI Hypertension  Patient spontaneous onset/nonreproducible back pain between shoulder blades.  Began yesterday while she was eating at [facility] and radiates up into her neck.  Troponin found elevated at 665, 805, 764, 694.  EKG shows T wave inversion in lead I and aVL, no previous tracings available for comparison. CXR without obvious mediastinal widening. Patient's symptoms are atypical and not clearly ACS.  Admit to cardiology. Place patient on continuous telemetry to monitor for arrhythmia. Check lipid panel and A1c. Transthoracic echocardiogram ordered  Check D-dimer. V/Q scan if elevated Check lipid panel and A1c. Start Coreg 6.25 mg twice daily for hypertension Start atorvastatin '40mg'$  QD. Plan for [facility] tomorrow with fluid replacement tonight  AKI (possible CKD)  With admission creatinine of 1.47.  Limited labs available for comparison, but this elevation is similar to last check in May 2023.  Recommend avoiding nephrotoxic agents at this time.   History of thyroid disease  We will check a TSH this admission.  Med reconciliation shows that at one point patient was taking 25 mcg of levothyroxine daily.  Risk Assessment/Risk Scores:    TIMI Risk Score for Unstable Angina or Non-ST Elevation MI:   The patient's TIMI risk score is 3, which indicates a 13% risk of all cause mortality, new or  recurrent myocardial infarction or need for urgent revascularization in the next 14 days.  Severity of Illness: The appropriate patient status for this patient is INPATIENT. Inpatient status is judged to be reasonable and necessary in order to provide the required intensity of service to ensure the patient's safety. The patient's presenting symptoms, physical exam findings, and initial radiographic and laboratory data in the context of their chronic comorbidities is felt to place them at high risk for further clinical deterioration. Furthermore, it is not anticipated that the patient will be medically stable for discharge from the hospital within 2 midnights of admission.   * I certify that at the point of admission it is my clinical judgment that the patient will require inpatient hospital care spanning beyond 2 midnights from the point of admission due to high intensity of service, high risk for further deterioration and high frequency of surveillance required.*   For questions or updates, please contact [place] Please consult [email or web address] for contact info under     Signed, Lily Kocher, PA-C  03/27/2022 1:27 PM

## 2022-03-27 NOTE — ED Provider Notes (Signed)
Patient transferred from [facility].  NSTEMI on heparin.  Has already been admitted to the cardiology service.  She is without chest pain on my evaluation.  Vital signs are normal.  Troponin are downtrending.  I have made cardiology team aware that she is here.  They will come and evaluate her and take care of her further for her NSTEMI.  This chart was dictated using voice recognition software.  Despite best efforts to proofread,  errors can occur which can change the documentation meaning.    Lennice Sites, DO 03/27/22 1033

## 2022-03-27 NOTE — Progress Notes (Addendum)
ANTICOAGULATION CONSULT NOTE - Follow-up Note  Pharmacy Consult for Heparin Indication: chest pain/ACS  No Known Allergies  Patient Measurements: Weight: 111.1 kg (245 lb) Heparin Dosing Weight: 80 kg  Vital Signs: Temp: 97.8 F (36.6 C) (10/19 2002) Temp Source: Oral (10/19 2002) BP: 107/71 (10/19 2100) Pulse Rate: 79 (10/19 2100)  Labs: Recent Labs    03/27/22 0000 03/27/22 0210 03/27/22 0555 03/27/22 0745 03/27/22 1138 03/27/22 2050  HGB 13.9  --   --   --   --   --   HCT 43.1  --   --   --   --   --   PLT 193  --   --   --   --   --   HEPARINUNFRC  --   --   --   --  0.40 0.30  CREATININE 1.47*  --   --   --   --   --   TROPONINIHS 665* 805* 764* 694*  --   --     Estimated Creatinine Clearance: 42.2 mL/min (A) (by C-G formula based on SCr of 1.47 mg/dL (H)).   Medical History: Past Medical History:  Diagnosis Date   Arthritis    Chicken pox    Glaucoma    Thyroid disease     Medications:  (Not in a hospital admission)  Scheduled:   [START ON 03/28/2022] aspirin EC  81 mg Oral Daily   atorvastatin  40 mg Oral QPM   carvedilol  6.25 mg Oral BID WC   Infusions:   heparin 1,200 Units/hr (03/27/22 2048)   PRN: acetaminophen, nitroGLYCERIN, ondansetron (ZOFRAN) IV  Assessment: 13 yof with a history of obesity, pre-diabetes, and thyroid disorder. Patient is presenting with chest pain. Heparin per pharmacy consult placed for chest pain/ACS.  Patient was not on anticoagulation prior to arrival.   Started on 1200 units/hr IV heparin following 4000 unit IV heparin bolus. Resulting heparin level was 0.4 which was therapeutic earlier today. Confirmatory level this evening is back at 0.3 which is borderline therapeutic.  No issues with infusion or bleeding per RN.  Hgb 13.9; plt 193  Goal of Therapy:  Heparin level 0.3-0.7 units/ml Monitor platelets by anticoagulation protocol: Yes   Plan:  INCREASE heparin infusion to 1250 units/hr Check anti-Xa  level at 0500 and daily while on heparin Continue to monitor H&H and platelets  Lorelei Pont, PharmD, BCPS 03/27/2022 9:26 PM ED Clinical Pharmacist -  [phone]

## 2022-03-27 NOTE — ED Provider Notes (Signed)
Patient has had NSTEMI.  She is admitted but no telemetry beds available at [facility] overnight.  Patient will require transport this morning.  I have reassessed the patient at 08: 30.  Patient is alert.  No distress.  Resting quietly.  Vital signs stable.  She denies any pain or needs at this time. Physical Exam  BP (!) 150/87   Pulse 81   Temp 98.6 F (37 C) (Oral)   Resp 18   Wt 111.1 kg   SpO2 96%   BMI 42.05 kg/m   Physical Exam Constitutional:      Comments: Alert nontoxic no distress  Pulmonary:     Effort: Pulmonary effort is normal.  Neurological:     Mental Status: She is oriented to person, place, and time.  Psychiatric:        Mood and Affect: Mood normal.     Procedures  Procedures Bedside monitor rhythm observed and interpreted by myself.  Normal sinus rhythm narrow complex rates controlled in the 70s and 80s. ED Course / MDM    Medical Decision Making Amount and/or Complexity of Data Reviewed Labs: ordered. Radiology: ordered. ECG/medicine tests: ordered.  Risk OTC drugs.   Patient requires transport.  She has been on heparin overnight.  She has no chest pain and no dyspnea.  Vital signs are stable.  Patient stable for transport ED to ED [facility] for necessary management of NSTEMI.  Patient requires higher level of care than continued monitoring at freestanding facility.  Dr. Gara Kroner accepts for ED to ED transfer       Charlesetta Shanks, MD 03/27/22 [id number]

## 2022-03-27 NOTE — Progress Notes (Signed)
ANTICOAGULATION CONSULT NOTE  Pharmacy Consult for Heparin Indication: chest pain/ACS  No Known Allergies  Patient Measurements: Weight: 111.1 kg (245 lb) Heparin Dosing Weight: 80 kg  Vital Signs: Temp: 98.2 F (36.8 C) (10/19 1031) Temp Source: Oral (10/19 1031) BP: 155/98 (10/19 1031) Pulse Rate: 85 (10/19 1031)  Labs: Recent Labs    03/27/22 0000 03/27/22 0210 03/27/22 0555 03/27/22 0745 03/27/22 1138  HGB 13.9  --   --   --   --   HCT 43.1  --   --   --   --   PLT 193  --   --   --   --   HEPARINUNFRC  --   --   --   --  0.40  CREATININE 1.47*  --   --   --   --   TROPONINIHS 665* 805* 764* 694*  --      Estimated Creatinine Clearance: 42.2 mL/min (A) (by C-G formula based on SCr of 1.47 mg/dL (H)).   Medical History: Past Medical History:  Diagnosis Date   Arthritis    Chicken pox    Glaucoma    Thyroid disease     Medications:  No current facility-administered medications on file prior to encounter.   Current Outpatient Medications on File Prior to Encounter  Medication Sig Dispense Refill   docusate sodium (COLACE) 100 MG capsule Take 1 capsule (100 mg total) by mouth every 12 (twelve) hours. 60 capsule 0   HYDROcodone-acetaminophen (NORCO) 5-325 MG tablet Take 1 tablet by mouth every 4 (four) hours as needed for severe pain. 20 tablet 0   levothyroxine (LEVOTHROID) 25 MCG tablet Take 1 tablet (25 mcg total) by mouth daily before breakfast. (Patient not taking: Reported on 10/07/2021) 30 tablet 5   naproxen sodium (ALEVE) 220 MG tablet Take 440 mg by mouth 2 (two) times daily as needed (foot pain).     ranitidine (ZANTAC) 150 MG tablet Take 1 tablet (150 mg total) by mouth daily. (Patient not taking: Reported on 10/07/2021) 30 tablet 2     Assessment: Madison Calhoun presenting with chest pain concerning for NSTEMI. Pharmacy consulted for heparin dosing while cardiology evaluates.  Heparin level 0.4 units/mL which is therapeutic while on heparin 1200 units/hr.  No concern for bleeding. CBC WNL  Goal of Therapy:  Heparin level 0.3-0.7 units/ml Monitor platelets by anticoagulation protocol: Yes   Plan:  Continue heparin 1200 units/hr Confirmatory heparin level at 2000 Daily heparin level and CBC, monitor signs/symptoms of bleeding  Merrilee Jansky, PharmD Clinical Pharmacist 03/27/2022,12:42 PM

## 2022-03-27 NOTE — ED Notes (Signed)
CRITICAL VALUE STICKER  CRITICAL VALUE:troponin 183  RECEIVER (on-site recipient of call):Madison Calhoun  DATE & TIME NOTIFIED: 03/27/22 0721  MESSENGER (representative from lab):lab  MD NOTIFIED: [place]  RESPONSE:

## 2022-03-28 ENCOUNTER — Inpatient Hospital Stay (HOSPITAL_COMMUNITY): Payer: Medicare Other

## 2022-03-28 ENCOUNTER — Encounter (HOSPITAL_COMMUNITY)
Admission: EM | Disposition: A | Discharge status: Home / Self Care | Payer: Self-pay | Source: Home / Self Care | Attending: Cardiovascular Disease

## 2022-03-28 DIAGNOSIS — R9431 Abnormal electrocardiogram [ECG] [EKG]: Secondary | ICD-10-CM | POA: Diagnosis not present

## 2022-03-28 DIAGNOSIS — I214 Non-ST elevation (NSTEMI) myocardial infarction: Secondary | ICD-10-CM | POA: Diagnosis not present

## 2022-03-28 DIAGNOSIS — I2510 Atherosclerotic heart disease of native coronary artery without angina pectoris: Secondary | ICD-10-CM | POA: Diagnosis not present

## 2022-03-28 DIAGNOSIS — E785 Hyperlipidemia, unspecified: Secondary | ICD-10-CM | POA: Diagnosis not present

## 2022-03-28 HISTORY — PX: CORONARY STENT INTERVENTION: CATH118234

## 2022-03-28 HISTORY — PX: LEFT HEART CATH AND CORONARY ANGIOGRAPHY: CATH118249

## 2022-03-28 HISTORY — PX: TRANSTHORACIC ECHOCARDIOGRAM: SHX275

## 2022-03-28 LAB — BASIC METABOLIC PANEL
Anion gap: 12 (ref 5–15)
Anion gap: 10 (ref 5–15)
BUN: 24 mg/dL — ABNORMAL HIGH (ref 8–23)
BUN: 23 mg/dL (ref 8–23)
CO2: 21 mmol/L — ABNORMAL LOW (ref 22–32)
CO2: 20 mmol/L — ABNORMAL LOW (ref 22–32)
Calcium: 8.9 mg/dL (ref 8.9–10.3)
Calcium: 8.7 mg/dL — ABNORMAL LOW (ref 8.9–10.3)
Chloride: 108 mmol/L (ref 98–111)
Chloride: 111 mmol/L (ref 98–111)
Creatinine, Ser: 1.49 mg/dL — ABNORMAL HIGH (ref 0.44–1.00)
Creatinine, Ser: 1.68 mg/dL — ABNORMAL HIGH (ref 0.44–1.00)
GFR, Estimated: 37 mL/min — ABNORMAL LOW (ref >60)
GFR, Estimated: 32 mL/min — ABNORMAL LOW (ref >60)
Glucose, Bld: 112 mg/dL — ABNORMAL HIGH (ref 70–99)
Glucose, Bld: 166 mg/dL — ABNORMAL HIGH (ref 70–99)
Potassium: 4.3 mmol/L (ref 3.5–5.1)
Potassium: 4 mmol/L (ref 3.5–5.1)
Sodium: 141 mmol/L (ref 135–145)
Sodium: 141 mmol/L (ref 135–145)

## 2022-03-28 LAB — CBC
HCT: 41.3 % (ref 36.0–46.0)
HCT: 36.2 % (ref 36.0–46.0)
Hemoglobin: 12.9 g/dL (ref 12.0–15.0)
Hemoglobin: 12.4 g/dL (ref 12.0–15.0)
MCH: 29.5 pg (ref 26.0–34.0)
MCH: 30.9 pg (ref 26.0–34.0)
MCHC: 31.2 g/dL (ref 30.0–36.0)
MCHC: 34.3 g/dL (ref 30.0–36.0)
MCV: 94.3 fL (ref 80.0–100.0)
MCV: 90.3 fL (ref 80.0–100.0)
Platelets: 151 10*3/uL (ref 150–400)
Platelets: 136 10*3/uL — ABNORMAL LOW (ref 150–400)
RBC: 4.38 MIL/uL (ref 3.87–5.11)
RBC: 4.01 MIL/uL (ref 3.87–5.11)
RDW: 12.7 % (ref 11.5–15.5)
RDW: 12.7 % (ref 11.5–15.5)
WBC: 7.5 10*3/uL (ref 4.0–10.5)
WBC: 7.3 10*3/uL (ref 4.0–10.5)
nRBC: 0 % (ref 0.0–0.2)
nRBC: 0 % (ref 0.0–0.2)

## 2022-03-28 LAB — ECHOCARDIOGRAM COMPLETE
AR max vel: 2.97 cm2
AV Area VTI: 3.12 cm2
AV Area mean vel: 2.93 cm2
AV Mean grad: 3 mmHg
AV Peak grad: 5.1 mmHg
Ao pk vel: 1.13 m/s
Area-P 1/2: 3.53 cm2
S' Lateral: 3.8 cm
Weight: 3920 oz

## 2022-03-28 LAB — LIPID PANEL
Cholesterol: 181 mg/dL (ref 0–200)
HDL: 42 mg/dL (ref >40)
LDL Cholesterol: 111 mg/dL — ABNORMAL HIGH (ref 0–99)
Total CHOL/HDL Ratio: 4.3 RATIO
Triglycerides: 141 mg/dL (ref <150)
VLDL: 28 mg/dL (ref 0–40)

## 2022-03-28 LAB — HEPARIN LEVEL (UNFRACTIONATED): Heparin Unfractionated: 0.34 IU/mL (ref 0.30–0.70)

## 2022-03-28 LAB — POCT ACTIVATED CLOTTING TIME
Activated Clotting Time: 287 seconds
Activated Clotting Time: 305 seconds

## 2022-03-28 SURGERY — LEFT HEART CATH AND CORONARY ANGIOGRAPHY
Anesthesia: LOCAL

## 2022-03-28 MED ORDER — LIDOCAINE HCL (PF) 1 % IJ SOLN
INTRAMUSCULAR | Status: AC
  Filled 2022-03-28: qty 30

## 2022-03-28 MED ORDER — FENTANYL CITRATE (PF) 100 MCG/2ML IJ SOLN
INTRAMUSCULAR | Status: AC
  Filled 2022-03-28: qty 2

## 2022-03-28 MED ORDER — NITROGLYCERIN 1 MG/10 ML FOR IR/CATH LAB
INTRA_ARTERIAL | Status: DC | PRN
  Administered 2022-03-28: 150 ug via INTRACORONARY
  Administered 2022-03-28: 200 ug via INTRACORONARY

## 2022-03-28 MED ORDER — SODIUM CHLORIDE 0.9 % WEIGHT BASED INFUSION: 1.0000 mL/kg/h | INTRAVENOUS | Status: DC

## 2022-03-28 MED ORDER — VERAPAMIL HCL 2.5 MG/ML IV SOLN
INTRAVENOUS | Status: AC
  Filled 2022-03-28: qty 2

## 2022-03-28 MED ORDER — HEPARIN SODIUM (PORCINE) 1000 UNIT/ML IJ SOLN
INTRAMUSCULAR | Status: AC
  Filled 2022-03-28: qty 10

## 2022-03-28 MED ORDER — SODIUM CHLORIDE 0.9 % WEIGHT BASED INFUSION
1.0000 mL/kg/h | INTRAVENOUS | Status: AC
  Administered 2022-03-28: 1 mL/kg/h via INTRAVENOUS

## 2022-03-28 MED ORDER — TICAGRELOR 90 MG PO TABS
ORAL_TABLET | ORAL | Status: DC | PRN
  Administered 2022-03-28: 180 mg via ORAL

## 2022-03-28 MED ORDER — SODIUM CHLORIDE 0.9% FLUSH: 3.0000 mL | Freq: Two times a day (BID) | INTRAVENOUS | Status: DC

## 2022-03-28 MED ORDER — LIDOCAINE HCL (PF) 1 % IJ SOLN
INTRAMUSCULAR | Status: DC | PRN
  Administered 2022-03-28: 2 mL

## 2022-03-28 MED ORDER — MIDAZOLAM HCL 2 MG/2ML IJ SOLN
INTRAMUSCULAR | Status: DC | PRN
  Administered 2022-03-28 (×3): 1 mg via INTRAVENOUS

## 2022-03-28 MED ORDER — LABETALOL HCL 5 MG/ML IV SOLN: 10.0000 mg | INTRAVENOUS | Status: AC | PRN

## 2022-03-28 MED ORDER — SODIUM CHLORIDE 0.9 % WEIGHT BASED INFUSION: 3.0000 mL/kg/h | INTRAVENOUS | Status: DC

## 2022-03-28 MED ORDER — FENTANYL CITRATE (PF) 100 MCG/2ML IJ SOLN
INTRAMUSCULAR | Status: DC | PRN
  Administered 2022-03-28 (×3): 25 ug via INTRAVENOUS

## 2022-03-28 MED ORDER — SODIUM CHLORIDE 0.9% FLUSH: 3.0000 mL | INTRAVENOUS | Status: DC | PRN

## 2022-03-28 MED ORDER — TICAGRELOR 90 MG PO TABS
90.0000 mg | ORAL_TABLET | Freq: Two times a day (BID) | ORAL | Status: DC
  Administered 2022-03-28 – 2022-03-29 (×2): 90 mg via ORAL
  Filled 2022-03-28 (×2): qty 1

## 2022-03-28 MED ORDER — VERAPAMIL HCL 2.5 MG/ML IV SOLN
INTRAVENOUS | Status: DC | PRN
  Administered 2022-03-28: 10 mL via INTRA_ARTERIAL

## 2022-03-28 MED ORDER — NITROGLYCERIN 1 MG/10 ML FOR IR/CATH LAB
INTRA_ARTERIAL | Status: AC
  Filled 2022-03-28: qty 10

## 2022-03-28 MED ORDER — HEPARIN SODIUM (PORCINE) 1000 UNIT/ML IJ SOLN
INTRAMUSCULAR | Status: DC | PRN
  Administered 2022-03-28: 6000 [IU] via INTRAVENOUS
  Administered 2022-03-28: 2000 [IU] via INTRAVENOUS
  Administered 2022-03-28: 5000 [IU] via INTRAVENOUS

## 2022-03-28 MED ORDER — HEPARIN (PORCINE) IN NACL 1000-0.9 UT/500ML-% IV SOLN
INTRAVENOUS | Status: DC | PRN
  Administered 2022-03-28 (×2): 500 mL

## 2022-03-28 MED ORDER — MIDAZOLAM HCL 2 MG/2ML IJ SOLN
INTRAMUSCULAR | Status: AC
  Filled 2022-03-28: qty 2

## 2022-03-28 MED ORDER — MORPHINE SULFATE (PF) 2 MG/ML IV SOLN
2.0000 mg | INTRAVENOUS | Status: DC | PRN
  Administered 2022-03-28: 2 mg via INTRAVENOUS
  Filled 2022-03-28: qty 1

## 2022-03-28 MED ORDER — TECHNETIUM TO 99M ALBUMIN AGGREGATED
4.3000 | Freq: Once | INTRAVENOUS | Status: AC | PRN
  Administered 2022-03-28: 4.3 via INTRAVENOUS

## 2022-03-28 MED ORDER — HEPARIN (PORCINE) IN NACL 1000-0.9 UT/500ML-% IV SOLN
INTRAVENOUS | Status: AC
  Filled 2022-03-28: qty 1000

## 2022-03-28 MED ORDER — HYDRALAZINE HCL 20 MG/ML IJ SOLN: 10.0000 mg | INTRAMUSCULAR | Status: AC | PRN

## 2022-03-28 MED ORDER — SODIUM CHLORIDE 0.9 % IV SOLN: 250.0000 mL | INTRAVENOUS | Status: DC | PRN

## 2022-03-28 MED ORDER — IOHEXOL 350 MG/ML SOLN
INTRAVENOUS | Status: DC | PRN
  Administered 2022-03-28: 85 mL via INTRA_ARTERIAL

## 2022-03-28 MED ORDER — SODIUM CHLORIDE 0.9 % WEIGHT BASED INFUSION
1.0000 mL/kg/h | INTRAVENOUS | Status: DC
  Administered 2022-03-28: 1 mL/kg/h via INTRAVENOUS

## 2022-03-28 SURGICAL SUPPLY — 26 items
BALL SAPPHIRE NC24 3.0X12 (BALLOONS) ×1
BALL SAPPHIRE NC24 4.0X10 (BALLOONS) ×1
BALLN SAPPHIRE 2.5X12 (BALLOONS) ×1
BALLOON SAPPHIRE 2.5X12 (BALLOONS) IMPLANT
BALLOON SAPPHIRE NC24 3.0X12 (BALLOONS) IMPLANT
BALLOON SAPPHIRE NC24 4.0X10 (BALLOONS) IMPLANT
CATH 5FR JL3.5 JR4 ANG PIG MP (CATHETERS) IMPLANT
CATH LAUNCHER 5F EBU4.0 (CATHETERS) IMPLANT
DEVICE RAD COMP TR BAND LRG (VASCULAR PRODUCTS) IMPLANT
ELECT DEFIB PAD ADLT CADENCE (PAD) IMPLANT
GLIDESHEATH SLEND SS 6F .021 (SHEATH) IMPLANT
GUIDEWIRE INQWIRE 1.5J.035X260 (WIRE) IMPLANT
INQWIRE 1.5J .035X260CM (WIRE) ×1
KIT ENCORE 26 ADVANTAGE (KITS) IMPLANT
KIT HEART LEFT (KITS) ×2 IMPLANT
KIT HEMO VALVE WATCHDOG (MISCELLANEOUS) IMPLANT
PACK CARDIAC CATHETERIZATION (CUSTOM PROCEDURE TRAY) ×2 IMPLANT
SHEATH GLIDE SLENDER 4/5FR (SHEATH) IMPLANT
SHEATH PROBE COVER 6X72 (BAG) IMPLANT
STENT SYNERGY XD 2.75X16 (Permanent Stent) IMPLANT
STENT SYNERGY XD 3.50X12 (Permanent Stent) IMPLANT
SYNERGY XD 2.75X16 (Permanent Stent) ×1 IMPLANT
SYNERGY XD 3.50X12 (Permanent Stent) ×1 IMPLANT
TRANSDUCER W/STOPCOCK (MISCELLANEOUS) ×2 IMPLANT
TUBING CIL FLEX 10 FLL-RA (TUBING) ×2 IMPLANT
WIRE COUGAR XT STRL 190CM (WIRE) IMPLANT

## 2022-03-28 NOTE — ED Notes (Signed)
Pt is having ECHO at the bedside at this time

## 2022-03-28 NOTE — Interval H&P Note (Signed)
History and Physical Interval Note:  03/28/2022 2:13 PM  Madison Calhoun  has presented today for surgery, with the diagnosis of nstemi.  The various methods of treatment have been discussed with the patient and family. After consideration of risks, benefits and other options for treatment, the patient has consented to  Procedure(s): LEFT HEART CATH AND CORONARY ANGIOGRAPHY (N/A) as a surgical intervention.  The patient's history has been reviewed, patient examined, no change in status, stable for surgery.  I have reviewed the patient's chart and labs.  Questions were answered to the patient's satisfaction.     Sherren Mocha

## 2022-03-28 NOTE — H&P (View-Only) (Signed)
Rounding Note    Patient Name: Madison Calhoun Date of Encounter: 03/28/2022  Madison Calhoun Cardiologist: None (wants to see Dr. Ellyn Calhoun.  He was her brother's cardiologist)  Subjective   Feels well.  No CP. No back pain.   Inpatient Medications    Scheduled Meds:  aspirin EC  81 mg Oral Daily   atorvastatin  40 mg Oral QPM   carvedilol  6.25 mg Oral BID WC   Continuous Infusions:  sodium chloride 1 mL/kg/hr (03/28/22 0522)   heparin 1,250 Units/hr (03/28/22 0747)   PRN Meds: acetaminophen, nitroGLYCERIN, ondansetron (ZOFRAN) IV   Vital Signs    Vitals:   03/28/22 0400 03/28/22 0500 03/28/22 0749 03/28/22 1000  BP: 118/64  (!) 122/99 119/84  Pulse: 75  83 76  Resp: '17  16 20  '$ Temp:  97.9 F (36.6 C) 97.7 F (36.5 C)   TempSrc:   Oral   SpO2: 93%  95% 95%  Weight:       No intake or output data in the 24 hours ending 03/28/22 1311    03/27/2022   12:58 AM 10/30/2014    1:52 PM  Last 3 Weights  Weight (lbs) 245 lb 243 lb 12.8 oz  Weight (kg) 111.131 kg 110.587 kg      Telemetry    Sinus rhythm, sinus tachycardia.  PVCs - Personally Reviewed  ECG    N/a - Personally Reviewed  Physical Exam   VS:  BP 119/84   Pulse 76   Temp 97.7 F (36.5 C) (Oral)   Resp 20   Wt 111.1 kg   SpO2 95%   BMI 42.05 kg/m  , BMI Body mass index is 42.05 kg/m. GENERAL:  Well appearing HEENT: Pupils equal round and reactive, fundi not visualized, oral mucosa unremarkable NECK:  No jugular venous distention, waveform within normal limits, carotid upstroke brisk and symmetric, no bruits, no thyromegaly LUNGS:  Clear to auscultation bilaterally HEART:  RRR.  PMI not displaced or sustained,S1 and S2 within normal limits, no S3, no S4, no clicks, no rubs, no murmurs ABD:  Flat, positive bowel sounds normal in frequency in pitch, no bruits, no rebound, no guarding, no midline pulsatile mass, no hepatomegaly, no splenomegaly EXT:  2 plus pulses throughout, no  edema, no cyanosis no clubbing SKIN:  No rashes no nodules NEURO:  Cranial nerves II through XII grossly intact, motor grossly intact throughout PSYCH:  Cognitively intact, oriented to person place and time   Labs    High Sensitivity Troponin:   Recent Labs  Lab 03/27/22 0000 03/27/22 0210 03/27/22 0555 03/27/22 0745  TROPONINIHS 665* 805* 764* 694*     Chemistry Recent Labs  Lab 03/27/22 0000 03/28/22 0506  NA 141 141  K 4.2 4.3  CL 109 108  CO2 23 21*  GLUCOSE 113* 112*  BUN 21 24*  CREATININE 1.47* 1.49*  CALCIUM 9.4 8.9  GFRNONAA 38* 37*  ANIONGAP 9 12    Lipids  Recent Labs  Lab 03/28/22 0506  CHOL 181  TRIG 141  HDL 42  LDLCALC 111*  CHOLHDL 4.3    Hematology Recent Labs  Lab 03/27/22 0000 03/28/22 0506  WBC 8.3 7.5  RBC 4.68 4.38  HGB 13.9 12.9  HCT 43.1 41.3  MCV 92.1 94.3  MCH 29.7 29.5  MCHC 32.3 31.2  RDW 12.7 12.7  PLT 193 151   Thyroid  Recent Labs  Lab 03/27/22 1423  TSH 3.565    BNPNo  results for input(s): "BNP", "PROBNP" in the last 168 hours.  DDimer  Recent Labs  Lab 03/27/22 1423  DDIMER 1.35*     Radiology    NM Pulmonary Perfusion  Result Date: 03/28/2022 CLINICAL DATA:  71 year old female with chest pain and shortness of breath. EXAM: NUCLEAR MEDICINE PERFUSION LUNG SCAN TECHNIQUE: Perfusion images were obtained in multiple projections after intravenous injection of radiopharmaceutical. Ventilation scans intentionally deferred if perfusion scan and chest x-ray adequate for interpretation during COVID 19 epidemic. RADIOPHARMACEUTICALS:  4.3 mCi Tc-61mMAA IV COMPARISON:  Portable chest 0814 hours today. FINDINGS: Homogeneous perfusion radiotracer activity in both lungs. Mediastinal photopenia corresponding to the portable chest. No pulmonary perfusion defect. IMPRESSION: No evidence of pulmonary embolus. Electronically Signed   By: HGenevie AnnM.D.   On: 03/28/2022 12:36   DG Chest Portable 1 View  Result Date:  03/28/2022 CLINICAL DATA:  Before V/Q scan.  Chest pain. EXAM: PORTABLE CHEST 1 VIEW COMPARISON:  Chest radiograph 03/27/2019 FINDINGS: No pleural effusion. No pneumothorax. Unchanged cardiac and mediastinal contours. Previously seen streaky opacities in the right middle lobe are no longer visualized. No displaced rib fractures. Visualized upper abdomen is unremarkable. Unchanged metallic surgical hardware projecting over the cervical spine. IMPRESSION: No acute cardiopulmonary disease. Previously seen streaky opacities in the right middle lobe are less conspicuous and favored to represent atelectasis Electronically Signed   By: HMarin RobertsM.D.   On: 03/28/2022 08:28   DG Chest 2 View  Result Date: 03/26/2022 CLINICAL DATA:  Chest pain. EXAM: CHEST - 2 VIEW COMPARISON:  None Available. FINDINGS: Upper normal heart size. Aortic atherosclerosis and tortuosity. There are streaky opacities in the right infrahilar lung, possibly localizing to the right middle lobe on lateral view. Mild bronchial thickening. No pleural effusion or pneumothorax. No acute osseous findings. IMPRESSION: 1. Streaky right middle lobe opacities may be atelectasis or pneumonia. Mild bronchial thickening. 2. Upper normal heart size with aortic atherosclerosis and tortuosity. Electronically Signed   By: MKeith RakeM.D.   On: 03/26/2022 23:43    Cardiac Studies   V/Q scan: Negative for PE  Echo pending  LHC pending  Patient Profile     [name] a 42F with morbid obesity and pre-diabetes admitted with back pain and elevated troponin.    Assessment & Plan   # Elevated troponin: # Atypical CP: # Hyperlipidemia: Hs-troponin elevated 665-->805-->764-->694.  EKG is without acute ischemic changes.  Symptoms are very atypical and occurred when eating.  She also had more back pain and chest pain.  D-dimer was mildly elevated so she went for V/Q scan, which was negative for PE.  Plan for LLa Joyalater today.  Continue  aspirin, heparin, and both carvedilol and atorvastatin were started this admission.  Shared Decision Making/Informed Consent The risks [stroke (1 in 1000), death (1 in 1000), kidney failure [usually temporary] (1 in 500), bleeding (1 in 200), allergic reaction [possibly serious] (1 in 200)], benefits (diagnostic support and management of coronary artery disease) and alternatives of a cardiac catheterization were discussed in detail with Ms. KCondronand she is willing to proceed.   For questions or updates, please contact [place]Please consult [email or web address] for contact info under        Signed, TSkeet Latch MD  03/28/2022, 1:11 PM

## 2022-03-28 NOTE — Progress Notes (Signed)
ANTICOAGULATION CONSULT NOTE - Follow-up Note  Pharmacy Consult for Heparin Indication: chest pain/ACS  No Known Allergies  Patient Measurements: Weight: 111.1 kg (245 lb) Heparin Dosing Weight: 80 kg  Vital Signs: Temp: 97.7 F (36.5 C) (10/20 0749) Temp Source: Oral (10/20 0749) BP: 122/99 (10/20 0749) Pulse Rate: 83 (10/20 0749)  Labs: Recent Labs    03/27/22 0000 03/27/22 0210 03/27/22 0555 03/27/22 0745 03/27/22 1138 03/27/22 2050 03/28/22 0506  HGB 13.9  --   --   --   --   --  12.9  HCT 43.1  --   --   --   --   --  41.3  PLT 193  --   --   --   --   --  151  HEPARINUNFRC  --   --   --   --  0.40 0.30 0.34  CREATININE 1.47*  --   --   --   --   --  1.49*  TROPONINIHS 665* 805* 764* 694*  --   --   --      Estimated Creatinine Clearance: 41.6 mL/min (A) (by C-G formula based on SCr of 1.49 mg/dL (H)).   Medical History: Past Medical History:  Diagnosis Date   Arthritis    Chicken pox    Glaucoma    Thyroid disease     Medications:  (Not in a hospital admission)  Scheduled:   aspirin EC  81 mg Oral Daily   atorvastatin  40 mg Oral QPM   carvedilol  6.25 mg Oral BID WC   Infusions:   sodium chloride 1 mL/kg/hr (03/28/22 0522)   heparin 1,250 Units/hr (03/28/22 0747)   PRN: acetaminophen, nitroGLYCERIN, ondansetron (ZOFRAN) IV  Assessment: 31 yof with a history of obesity, pre-diabetes, and thyroid disorder. Patient is presenting with chest pain. Heparin per pharmacy consult placed for chest pain/ACS.  Patient was not on anticoagulation prior to arrival.   Heparin level is therapeutic at 0.34 today after a slight increase overnight.   Hgb 12.9; plt 151  Goal of Therapy:  Heparin level 0.3-0.7 units/ml Monitor platelets by anticoagulation protocol: Yes   Plan:  continue heparin infusion at 1250 units/hr Check anti-Xa level daily while on heparin Continue to monitor H&H and platelets  Titus Dubin, PharmD PGY1 Pharmacy  Resident 03/28/2022 7:59 AM

## 2022-03-28 NOTE — ED Notes (Addendum)
Pt is being taken to nuc med on tele, DM to RN on floor to let her know

## 2022-03-28 NOTE — ED Notes (Addendum)
Nuc med will take pt to 6E4 after VQ scan

## 2022-03-28 NOTE — Progress Notes (Addendum)
Rounding Note    Patient Name: Madison Calhoun Date of Encounter: 03/28/2022  [place] Cardiologist: None (wants to see Dr. Ellyn Hack.  He was her brother's cardiologist)  Subjective   Feels well.  No CP. No back pain.   Inpatient Medications    Scheduled Meds:  aspirin EC  81 mg Oral Daily   atorvastatin  40 mg Oral QPM   carvedilol  6.25 mg Oral BID WC   Continuous Infusions:  sodium chloride 1 mL/kg/hr (03/28/22 0522)   heparin 1,250 Units/hr (03/28/22 0747)   PRN Meds: acetaminophen, nitroGLYCERIN, ondansetron (ZOFRAN) IV   Vital Signs    Vitals:   03/28/22 0400 03/28/22 0500 03/28/22 0749 03/28/22 1000  BP: 118/64  (!) 122/99 119/84  Pulse: 75  83 76  Resp: '17  16 20  '$ Temp:  97.9 F (36.6 C) 97.7 F (36.5 C)   TempSrc:   Oral   SpO2: 93%  95% 95%  Weight:       No intake or output data in the 24 hours ending 03/28/22 1311    03/27/2022   12:58 AM 10/30/2014    1:52 PM  Last 3 Weights  Weight (lbs) 245 lb 243 lb 12.8 oz  Weight (kg) 111.131 kg 110.587 kg      Telemetry    Sinus rhythm, sinus tachycardia.  PVCs - Personally Reviewed  ECG    N/a - Personally Reviewed  Physical Exam   VS:  BP 119/84   Pulse 76   Temp 97.7 F (36.5 C) (Oral)   Resp 20   Wt 111.1 kg   SpO2 95%   BMI 42.05 kg/m  , BMI Body mass index is 42.05 kg/m. GENERAL:  Well appearing HEENT: Pupils equal round and reactive, fundi not visualized, oral mucosa unremarkable NECK:  No jugular venous distention, waveform within normal limits, carotid upstroke brisk and symmetric, no bruits, no thyromegaly LUNGS:  Clear to auscultation bilaterally HEART:  RRR.  PMI not displaced or sustained,S1 and S2 within normal limits, no S3, no S4, no clicks, no rubs, no murmurs ABD:  Flat, positive bowel sounds normal in frequency in pitch, no bruits, no rebound, no guarding, no midline pulsatile mass, no hepatomegaly, no splenomegaly EXT:  2 plus pulses throughout, no  edema, no cyanosis no clubbing SKIN:  No rashes no nodules NEURO:  Cranial nerves II through XII grossly intact, motor grossly intact throughout PSYCH:  Cognitively intact, oriented to person place and time   Labs    High Sensitivity Troponin:   Recent Labs  Lab 03/27/22 0000 03/27/22 0210 03/27/22 0555 03/27/22 0745  TROPONINIHS 665* 805* 764* 694*     Chemistry Recent Labs  Lab 03/27/22 0000 03/28/22 0506  NA 141 141  K 4.2 4.3  CL 109 108  CO2 23 21*  GLUCOSE 113* 112*  BUN 21 24*  CREATININE 1.47* 1.49*  CALCIUM 9.4 8.9  GFRNONAA 38* 37*  ANIONGAP 9 12    Lipids  Recent Labs  Lab 03/28/22 0506  CHOL 181  TRIG 141  HDL 42  LDLCALC 111*  CHOLHDL 4.3    Hematology Recent Labs  Lab 03/27/22 0000 03/28/22 0506  WBC 8.3 7.5  RBC 4.68 4.38  HGB 13.9 12.9  HCT 43.1 41.3  MCV 92.1 94.3  MCH 29.7 29.5  MCHC 32.3 31.2  RDW 12.7 12.7  PLT 193 151   Thyroid  Recent Labs  Lab 03/27/22 1423  TSH 3.565    BNPNo  results for input(s): "BNP", "PROBNP" in the last 168 hours.  DDimer  Recent Labs  Lab 03/27/22 1423  DDIMER 1.35*     Radiology    NM Pulmonary Perfusion  Result Date: 03/28/2022 CLINICAL DATA:  71 year old female with chest pain and shortness of breath. EXAM: NUCLEAR MEDICINE PERFUSION LUNG SCAN TECHNIQUE: Perfusion images were obtained in multiple projections after intravenous injection of radiopharmaceutical. Ventilation scans intentionally deferred if perfusion scan and chest x-ray adequate for interpretation during COVID 19 epidemic. RADIOPHARMACEUTICALS:  4.3 mCi Tc-73mMAA IV COMPARISON:  Portable chest 0814 hours today. FINDINGS: Homogeneous perfusion radiotracer activity in both lungs. Mediastinal photopenia corresponding to the portable chest. No pulmonary perfusion defect. IMPRESSION: No evidence of pulmonary embolus. Electronically Signed   By: HGenevie AnnM.D.   On: 03/28/2022 12:36   DG Chest Portable 1 View  Result Date:  03/28/2022 CLINICAL DATA:  Before V/Q scan.  Chest pain. EXAM: PORTABLE CHEST 1 VIEW COMPARISON:  Chest radiograph 03/27/2019 FINDINGS: No pleural effusion. No pneumothorax. Unchanged cardiac and mediastinal contours. Previously seen streaky opacities in the right middle lobe are no longer visualized. No displaced rib fractures. Visualized upper abdomen is unremarkable. Unchanged metallic surgical hardware projecting over the cervical spine. IMPRESSION: No acute cardiopulmonary disease. Previously seen streaky opacities in the right middle lobe are less conspicuous and favored to represent atelectasis Electronically Signed   By: HMarin RobertsM.D.   On: 03/28/2022 08:28   DG Chest 2 View  Result Date: 03/26/2022 CLINICAL DATA:  Chest pain. EXAM: CHEST - 2 VIEW COMPARISON:  None Available. FINDINGS: Upper normal heart size. Aortic atherosclerosis and tortuosity. There are streaky opacities in the right infrahilar lung, possibly localizing to the right middle lobe on lateral view. Mild bronchial thickening. No pleural effusion or pneumothorax. No acute osseous findings. IMPRESSION: 1. Streaky right middle lobe opacities may be atelectasis or pneumonia. Mild bronchial thickening. 2. Upper normal heart size with aortic atherosclerosis and tortuosity. Electronically Signed   By: MKeith RakeM.D.   On: 03/26/2022 23:43    Cardiac Studies   V/Q scan: Negative for PE  Echo pending  LHC pending  Patient Profile     Ms. KDebruynis a 24F with morbid obesity and pre-diabetes admitted with back pain and elevated troponin.    Assessment & Plan   # Elevated troponin: # Atypical CP: # Hyperlipidemia: Hs-troponin elevated 665-->805-->764-->694.  EKG is without acute ischemic changes.  Symptoms are very atypical and occurred when eating.  She also had more back pain and chest pain.  D-dimer was mildly elevated so she went for V/Q scan, which was negative for PE.  Plan for LHurleylater today.  Continue  aspirin, heparin, and both carvedilol and atorvastatin were started this admission.  Shared Decision Making/Informed Consent The risks [stroke (1 in 1000), death (1 in 1000), kidney failure [usually temporary] (1 in 500), bleeding (1 in 200), allergic reaction [possibly serious] (1 in 200)], benefits (diagnostic support and management of coronary artery disease) and alternatives of a cardiac catheterization were discussed in detail with Ms. KCanionand she is willing to proceed.   For questions or updates, please contact [place]Please consult [email or web address] for contact info under        Signed, TSkeet Latch MD  03/28/2022, 1:11 PM

## 2022-03-28 NOTE — ED Notes (Signed)
Ns given per order. Would not allow this RN to scan correctly

## 2022-03-28 NOTE — ED Notes (Signed)
[facility] calls to say they will get pt for VQ scan after portable chest xray which they requested.  Entered this order

## 2022-03-28 NOTE — Progress Notes (Incomplete)
Echocardiogram 2D Echocardiogram has been performed.  Ronny Flurry 03/28/2022, 11:40 AM

## 2022-03-28 NOTE — Progress Notes (Addendum)
   Called by Nurse, pt developed small hematoma, first was expressed and now another has developed at her wrist.  Pressure with band continues.  Well hold pressure, now with SOB as well.  CXR today was clear, VQ scan was neg for PE.  Cath was done and pt underwent DES to p to mid LAD.  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  Now with SOB will try Cola and 2 mg IV morphine.  No chest pain.  BP 112.59 R 20 HR 74.    Lungs clear, heart S1S2 RRR SR on monitor she feels she will pass out.  Will check EKG, PCXR and labs.  Cecilie Kicks, FNP-C [facility]  [id number] or after 5pm and on weekends call [phone] 03/28/2022.

## 2022-03-28 NOTE — ED Notes (Signed)
ED TO INPATIENT HANDOFF REPORT  ED Nurse Name and Phone #: Bronson Bressman  S Name/Age/Gender [name] 71 y.o. female Room/Bed: 045C/045C  Code Status   Code Status: Full Code  Home/SNF/Other Home Patient oriented to: self, place, time, and situation Is this baseline? Yes   Triage Complete: Triage complete  Chief Complaint NSTEMI (non-ST elevated myocardial infarction) [facility]) [I21.4]  Triage Note Upper Back/ shoulder pain  Started in 1987 Worse today. OTC pain reliever not effective today. Denies any new injury. No distress in triage   Allergies No Known Allergies  Level of Care/Admitting Diagnosis ED Disposition     ED Disposition  Admit   Condition  --   [place]: [place] [[id number]]  Level of Care: Telemetry Cardiac [[id number]]  May admit patient to [facility] or [facility] if equivalent level of care is available:: No  Covid Evaluation: Asymptomatic - no recent exposure (last 10 days) testing not required  Diagnosis: NSTEMI (non-ST elevated myocardial infarction) [facility]) [[id number]]  Admitting Physician: Skeet Latch [[id number]]  Attending Physician: Skeet Latch [[id number]]  Certification:: I certify this patient will need inpatient services for at least 2 midnights  Estimated Length of Stay: 2          B Medical/Surgery History Past Medical History:  Diagnosis Date   Arthritis    Chicken pox    Glaucoma    Thyroid disease    Past Surgical History:  Procedure Laterality Date   BUNIONECTOMY     CATARACT EXTRACTION W/ INTRAOCULAR LENS IMPLANT Bilateral    [place]       A IV Location/Drains/Wounds Patient Lines/Drains/Airways Status     Active Line/Drains/Airways     Name Placement date Placement time Site Days   Peripheral IV 03/27/22 20 G Left Antecubital 03/27/22  0046  Antecubital  1   Peripheral IV 03/28/22 22 G 1" Distal;Right;Upper;Lateral Arm  03/28/22  0511  Arm  less than 1   External Urinary Catheter 03/27/22  0408  --  1            Intake/Output Last 24 hours  Intake/Output Summary (Last 24 hours) at 03/28/2022 0848 Last data filed at 03/27/2022 1233 Gross per 24 hour  Intake --  Output 300 ml  Net -300 ml    Labs/Imaging Results for orders placed or performed during the hospital encounter of 03/26/22 (from the past 48 hour(s))  CBC with Differential     Status: None   Collection Time: 03/27/22 12:00 AM  Result Value Ref Range   WBC 8.3 4.0 - 10.5 K/uL   RBC 4.68 3.87 - 5.11 MIL/uL   Hemoglobin 13.9 12.0 - 15.0 g/dL   HCT 43.1 36.0 - 46.0 %   MCV 92.1 80.0 - 100.0 fL   MCH 29.7 26.0 - 34.0 pg   MCHC 32.3 30.0 - 36.0 g/dL   RDW 12.7 11.5 - 15.5 %   Platelets 193 150 - 400 K/uL   nRBC 0.0 0.0 - 0.2 %   Neutrophils Relative % 75 %   Neutro Abs 6.2 1.7 - 7.7 K/uL   Lymphocytes Relative 15 %   Lymphs Abs 1.3 0.7 - 4.0 K/uL   Monocytes Relative 8 %   Monocytes Absolute 0.6 0.1 - 1.0 K/uL   Eosinophils Relative 1 %   Eosinophils Absolute 0.1 0.0 - 0.5 K/uL   Basophils Relative 1 %   Basophils  Absolute 0.0 0.0 - 0.1 K/uL   Immature Granulocytes 0 %   Abs Immature Granulocytes 0.02 0.00 - 0.07 K/uL    Comment: Performed at [facility], [address] [place], [place] [address]  Basic metabolic panel     Status: Abnormal   Collection Time: 03/27/22 12:00 AM  Result Value Ref Range   Sodium 141 135 - 145 mmol/L   Potassium 4.2 3.5 - 5.1 mmol/L   Chloride 109 98 - 111 mmol/L   CO2 23 22 - 32 mmol/L   Glucose, Bld 113 (H) 70 - 99 mg/dL    Comment: Glucose reference range applies only to samples taken after fasting for at least 8 hours.   BUN 21 8 - 23 mg/dL   Creatinine, Ser 1.47 (H) 0.44 - 1.00 mg/dL   Calcium 9.4 8.9 - 10.3 mg/dL   GFR, Estimated 38 (L) >60 mL/min    Comment: (NOTE) Calculated using the CKD-EPI Creatinine Equation (2021)    Anion gap 9 5 - 15    Comment:  Performed at [facility], [place], [place] [address]  Troponin I (High Sensitivity)     Status: Abnormal   Collection Time: 03/27/22 12:00 AM  Result Value Ref Range   Troponin I (High Sensitivity) 665 (HH) <18 ng/L    Comment: CRITICAL RESULT CALLED TO, READ BACK BY AND VERIFIED WITH: BRITTANY HUNNICUT RN '@0042'$  03/27/22.YPF (NOTE) Elevated high sensitivity troponin I (hsTnI) values and significant  changes across serial measurements may suggest ACS but many other  chronic and acute conditions are known to elevate hsTnI results.  Refer to the Links section for chest pain algorithms and additional  guidance. Performed at [facility], [address], [place], [place] [address]   Troponin I (High Sensitivity)     Status: Abnormal   Collection Time: 03/27/22  2:10 AM  Result Value Ref Range   Troponin I (High Sensitivity) 805 (HH) <18 ng/L    Comment: CRITICAL RESULT CALLED TO, READ BACK BY AND VERIFIED WITH: BRITTANY HUNNICUT RN, '@0317'$  03/27/22.YPF (NOTE) Elevated high sensitivity troponin I (hsTnI) values and significant  changes across serial measurements may suggest ACS but many other  chronic and acute conditions are known to elevate hsTnI results.  Refer to the Links section for chest pain algorithms and additional  guidance. Performed at [facility], [address], [place], [place] [address]   Troponin I (High Sensitivity)     Status: Abnormal   Collection Time: 03/27/22  5:55 AM  Result Value Ref Range   Troponin I (High Sensitivity) 764 (HH) <18 ng/L    Comment: DELTA CHECK NOTED CRITICAL RESULT CALLED TO, READ BACK BY AND VERIFIED WITH: ISSAC EVANS, RN ON 03/27/22 by CM at 3419 (NOTE) Elevated high sensitivity troponin I (hsTnI) values and significant  changes across serial measurements may suggest ACS but many other  chronic and acute conditions are known to elevate hsTnI results.  Refer  to the Links section for chest pain algorithms and additional  guidance. Performed at [facility], [address] [place], [place] [address]   Troponin I (High Sensitivity)     Status: Abnormal   Collection Time: 03/27/22  7:45 AM  Result Value Ref Range   Troponin I (High Sensitivity) 694 (HH) <18 ng/L    Comment: DELTA CHECK NOTED CRITICAL VALUE NOTED.  VALUE IS CONSISTENT WITH PREVIOUSLY REPORTED AND CALLED VALUE. (NOTE) Elevated high sensitivity troponin I (hsTnI) values and significant  changes across serial measurements may suggest ACS but many other  chronic and acute conditions are known to elevate hsTnI results.  Refer to the Links section for chest pain algorithms and additional  guidance. Performed at [facility], [place], [place] [address]   Heparin level (unfractionated)     Status: None   Collection Time: 03/27/22 11:38 AM  Result Value Ref Range   Heparin Unfractionated 0.40 0.30 - 0.70 IU/mL    Comment: (NOTE) The clinical reportable range upper limit is being lowered to >1.10 to align with the FDA approved guidance for the current laboratory assay.  If heparin results are below expected values, and patient dosage has  been confirmed, suggest follow up testing of antithrombin III levels. Performed at [facility], [place] [address] [place], [place] [address]   TSH     Status: None   Collection Time: 03/27/22  2:23 PM  Result Value Ref Range   TSH 3.565 0.350 - 4.500 uIU/mL    Comment: Performed by a 3rd Generation assay with a functional sensitivity of <=0.01 uIU/mL. Performed at [facility], [place] [address] [place], [place] [address]   D-dimer, quantitative     Status: Abnormal   Collection Time: 03/27/22  2:23 PM  Result Value Ref Range   D-Dimer, Quant 1.35 (H) 0.00 - 0.50 ug/mL-FEU    Comment: (NOTE) At the manufacturer cut-off value of 0.5 g/mL FEU, this assay has a negative  predictive value of 95-100%.This assay is intended for use in conjunction with a clinical pretest probability (PTP) assessment model to exclude pulmonary embolism (PE) and deep venous thrombosis (DVT) in outpatients suspected of PE or DVT. Results should be correlated with clinical presentation. Performed at [facility], [place] [address], [place], [place] [address]   Hemoglobin A1c     Status: Abnormal   Collection Time: 03/27/22  2:23 PM  Result Value Ref Range   Hgb A1c MFr Bld 5.9 (H) 4.8 - 5.6 %    Comment: (NOTE) Pre diabetes:          5.7%-6.4%  Diabetes:              >6.4%  Glycemic control for   <7.0% adults with diabetes    Mean Plasma Glucose 122.63 mg/dL    Comment: Performed at [facility] [address] [place], [place] [address]  Heparin level (unfractionated)     Status: None   Collection Time: 03/27/22  8:50 PM  Result Value Ref Range   Heparin Unfractionated 0.30 0.30 - 0.70 IU/mL    Comment: (NOTE) The clinical reportable range upper limit is being lowered to >1.10 to align with the FDA approved guidance for the current laboratory assay.  If heparin results are below expected values, and patient dosage has  been confirmed, suggest follow up testing of antithrombin III levels. Performed at [facility], [place] [address], [place], [place] [address]   Heparin level (unfractionated)     Status: None   Collection Time: 03/28/22  5:06 AM  Result Value Ref Range   Heparin Unfractionated 0.34 0.30 - 0.70 IU/mL    Comment: (NOTE) The clinical reportable range upper limit is being lowered to >1.10 to align with the FDA approved guidance for the current laboratory assay.  If heparin results are below expected values, and patient dosage has  been confirmed, suggest follow up testing of antithrombin III levels. Performed at [facility], [address] [place] [address]   CBC     Status: None   Collection Time: 03/28/22  5:06  AM  Result Value Ref Range   WBC 7.5 4.0 - 10.5 K/uL   RBC 4.38 3.87 - 5.11 MIL/uL   Hemoglobin 12.9 12.0 - 15.0 g/dL   HCT 41.3 36.0 - 46.0 %   MCV 94.3 80.0 - 100.0 fL   MCH 29.5 26.0 - 34.0 pg   MCHC 31.2 30.0 - 36.0 g/dL   RDW 12.7 11.5 - 15.5 %   Platelets 151 150 - 400 K/uL   nRBC 0.0 0.0 - 0.2 %    Comment: Performed at [facility], [place] [address], [place], [place] [address]  Lipid panel     Status: Abnormal   Collection Time: 03/28/22  5:06 AM  Result Value Ref Range   Cholesterol 181 0 - 200 mg/dL   Triglycerides 141 <150 mg/dL   HDL 42 >40 mg/dL   Total CHOL/HDL Ratio 4.3 RATIO   VLDL 28 0 - 40 mg/dL   LDL Cholesterol 111 (H) 0 - 99 mg/dL    Comment:        Total Cholesterol/HDL:CHD Risk Coronary Heart Disease Risk Table                     Men   Women  1/2 Average Risk   3.4   3.3  Average Risk       5.0   4.4  2 X Average Risk   9.6   7.1  3 X Average Risk  23.4   11.0        Use the calculated Patient Ratio above and the CHD Risk Table to determine the patient's CHD Risk.        ATP III CLASSIFICATION (LDL):  <100     mg/dL   Optimal  100-129  mg/dL   Near or Above                    Optimal  130-159  mg/dL   Borderline  160-189  mg/dL   High  >190     mg/dL   Very High Performed at [facility] [address], [place], [place] [address]   Basic metabolic panel     Status: Abnormal   Collection Time: 03/28/22  5:06 AM  Result Value Ref Range   Sodium 141 135 - 145 mmol/L   Potassium 4.3 3.5 - 5.1 mmol/L   Chloride 108 98 - 111 mmol/L   CO2 21 (L) 22 - 32 mmol/L   Glucose, Bld 112 (H) 70 - 99 mg/dL    Comment: Glucose reference range applies only to samples taken after fasting for at least 8 hours.   BUN 24 (H) 8 - 23 mg/dL   Creatinine, Ser 1.49 (H) 0.44 - 1.00 mg/dL   Calcium 8.9 8.9 - 10.3 mg/dL   GFR, Estimated 37 (L) >60 mL/min    Comment: (NOTE) Calculated using the CKD-EPI Creatinine Equation (2021)    Anion gap 12 5 - 15     Comment: Performed at [facility] [address], [place], [place] [address]   DG Chest Portable 1 View  Result Date: 03/28/2022 CLINICAL DATA:  Before V/Q scan.  Chest pain. EXAM: PORTABLE CHEST 1 VIEW COMPARISON:  Chest radiograph 03/27/2019 FINDINGS: No pleural effusion. No pneumothorax. Unchanged cardiac and mediastinal contours. Previously seen streaky opacities in  the right middle lobe are no longer visualized. No displaced rib fractures. Visualized upper abdomen is unremarkable. Unchanged metallic surgical hardware projecting over the cervical spine. IMPRESSION: No acute cardiopulmonary disease. Previously seen streaky opacities in the right middle lobe are less conspicuous and favored to represent atelectasis Electronically Signed   By: Marin Roberts M.D.   On: 03/28/2022 08:28   DG Chest 2 View  Result Date: 03/26/2022 CLINICAL DATA:  Chest pain. EXAM: CHEST - 2 VIEW COMPARISON:  None Available. FINDINGS: Upper normal heart size. Aortic atherosclerosis and tortuosity. There are streaky opacities in the right infrahilar lung, possibly localizing to the right middle lobe on lateral view. Mild bronchial thickening. No pleural effusion or pneumothorax. No acute osseous findings. IMPRESSION: 1. Streaky right middle lobe opacities may be atelectasis or pneumonia. Mild bronchial thickening. 2. Upper normal heart size with aortic atherosclerosis and tortuosity. Electronically Signed   By: Keith Rake M.D.   On: 03/26/2022 23:43    Pending Labs Unresulted Labs (From admission, onward)     Start     Ordered   03/28/22 0500  Heparin level (unfractionated)  Daily at 5am,   R     See Hyperspace for full Linked Orders Report.   03/27/22 1244   03/28/22 0500  CBC  Daily at 5am,   R     See Hyperspace for full Linked Orders Report.   03/27/22 1244   03/28/22 0500  Lipoprotein A (LPA)  Tomorrow morning,   R        03/27/22 1403   03/28/22 0500  Heparin level (unfractionated)   Once-Timed,   TIMED        03/27/22 2131            Vitals/Pain Today's Vitals   03/28/22 0400 03/28/22 0500 03/28/22 0748 03/28/22 0749  BP: 118/64   (!) 122/99  Pulse: 75   83  Resp: 17   16  Temp:  97.9 F (36.6 C)  97.7 F (36.5 C)  TempSrc:    Oral  SpO2: 93%   95%  Weight:      PainSc:   0-No pain 0-No pain    Isolation Precautions No active isolations  Medications Medications  heparin ADULT infusion 100 units/mL (25000 units/237m) (1,250 Units/hr Intravenous Rate/Dose Verify 03/28/22 0747)  acetaminophen (TYLENOL) tablet 650 mg (650 mg Oral Given 03/27/22 1227)  carvedilol (COREG) tablet 6.25 mg (6.25 mg Oral Given 03/28/22 0746)  aspirin EC tablet 81 mg (81 mg Oral Given 03/28/22 0746)  nitroGLYCERIN (NITROSTAT) SL tablet 0.4 mg (has no administration in time range)  ondansetron (ZOFRAN) injection 4 mg (has no administration in time range)  atorvastatin (LIPITOR) tablet 40 mg (40 mg Oral Given 03/27/22 1715)  0.9% sodium chloride infusion (3 mL/kg/hr  111.1 kg Intravenous Not Given 03/28/22 0453)    Followed by  0.9% sodium chloride infusion (1 mL/kg/hr  111.1 kg Intravenous New Bag/Given 03/28/22 0522)  acetaminophen (TYLENOL) tablet 1,000 mg (1,000 mg Oral Given 03/26/22 2347)  aspirin chewable tablet 324 mg (324 mg Oral Given 03/27/22 0052)  heparin bolus via infusion 4,000 Units (4,000 Units Intravenous Bolus from Bag 03/27/22 0125)    Mobility walks Low fall risk   Focused Assessments Pt is pain free.  She will go to VQ scan shortly and then come up after I expect   R Recommendations: See Admitting Provider Note  Report given to:   Additional Notes:  Cardiac cath today 3pm.  VQ scan this am.  Pt is NPO

## 2022-03-28 NOTE — ED Notes (Signed)
Pt was not transported up immediately as I was under the impression that pt was being taken to nuc med at 0830, verified with them that they would come in an hour to get pt

## 2022-03-29 ENCOUNTER — Encounter (HOSPITAL_COMMUNITY): Payer: Self-pay | Admitting: Cardiovascular Disease

## 2022-03-29 DIAGNOSIS — I2510 Atherosclerotic heart disease of native coronary artery without angina pectoris: Secondary | ICD-10-CM

## 2022-03-29 DIAGNOSIS — I214 Non-ST elevation (NSTEMI) myocardial infarction: Secondary | ICD-10-CM | POA: Diagnosis not present

## 2022-03-29 DIAGNOSIS — N183 Chronic kidney disease, stage 3 unspecified: Secondary | ICD-10-CM

## 2022-03-29 DIAGNOSIS — E785 Hyperlipidemia, unspecified: Secondary | ICD-10-CM

## 2022-03-29 DIAGNOSIS — I255 Ischemic cardiomyopathy: Secondary | ICD-10-CM

## 2022-03-29 HISTORY — DX: Atherosclerotic heart disease of native coronary artery without angina pectoris: I25.10

## 2022-03-29 HISTORY — DX: Ischemic cardiomyopathy: I25.5

## 2022-03-29 HISTORY — DX: Chronic kidney disease, stage 3 unspecified: N18.30

## 2022-03-29 HISTORY — DX: Hyperlipidemia, unspecified: E78.5

## 2022-03-29 LAB — CBC
HCT: 35.2 % — ABNORMAL LOW (ref 36.0–46.0)
Hemoglobin: 11.3 g/dL — ABNORMAL LOW (ref 12.0–15.0)
MCH: 29.3 pg (ref 26.0–34.0)
MCHC: 32.1 g/dL (ref 30.0–36.0)
MCV: 91.2 fL (ref 80.0–100.0)
Platelets: 134 10*3/uL — ABNORMAL LOW (ref 150–400)
RBC: 3.86 MIL/uL — ABNORMAL LOW (ref 3.87–5.11)
RDW: 12.8 % (ref 11.5–15.5)
WBC: 6.4 10*3/uL (ref 4.0–10.5)
nRBC: 0 % (ref 0.0–0.2)

## 2022-03-29 LAB — BASIC METABOLIC PANEL
Anion gap: 6 (ref 5–15)
BUN: 22 mg/dL (ref 8–23)
CO2: 20 mmol/L — ABNORMAL LOW (ref 22–32)
Calcium: 8.3 mg/dL — ABNORMAL LOW (ref 8.9–10.3)
Chloride: 112 mmol/L — ABNORMAL HIGH (ref 98–111)
Creatinine, Ser: 1.54 mg/dL — ABNORMAL HIGH (ref 0.44–1.00)
GFR, Estimated: 36 mL/min — ABNORMAL LOW (ref >60)
Glucose, Bld: 92 mg/dL (ref 70–99)
Potassium: 3.9 mmol/L (ref 3.5–5.1)
Sodium: 138 mmol/L (ref 135–145)

## 2022-03-29 LAB — LIPOPROTEIN A (LPA): Lipoprotein (a): 33.4 nmol/L — ABNORMAL HIGH (ref <75.0)

## 2022-03-29 MED ORDER — ASPIRIN 81 MG PO TBEC: 81.0000 mg | DELAYED_RELEASE_TABLET | Freq: Every day | ORAL | 12 refills | Status: DC

## 2022-03-29 MED ORDER — CARVEDILOL 6.25 MG PO TABS: 6.2500 mg | ORAL_TABLET | Freq: Two times a day (BID) | ORAL | 11 refills | Status: DC

## 2022-03-29 MED ORDER — ATORVASTATIN CALCIUM 40 MG PO TABS: 40.0000 mg | ORAL_TABLET | Freq: Every evening | ORAL | 11 refills | Status: DC

## 2022-03-29 MED ORDER — NITROGLYCERIN 0.4 MG SL SUBL: 0.4000 mg | SUBLINGUAL_TABLET | SUBLINGUAL | 12 refills | Status: AC | PRN

## 2022-03-29 MED ORDER — TICAGRELOR 90 MG PO TABS: 90.0000 mg | ORAL_TABLET | Freq: Two times a day (BID) | ORAL | 11 refills | Status: DC

## 2022-03-29 NOTE — Progress Notes (Signed)
Rounding Note    Patient Name: Madison Calhoun Date of Encounter: 03/29/2022  [place] Cardiologist: None (wants to see Dr. Ellyn Hack.  He was her brother's cardiologist)  Subjective   Feels well today without complaint  Inpatient Medications    Scheduled Meds:  aspirin EC  81 mg Oral Daily   atorvastatin  40 mg Oral QPM   carvedilol  6.25 mg Oral BID WC   sodium chloride flush  3 mL Intravenous Q12H   ticagrelor  90 mg Oral BID   Continuous Infusions:  sodium chloride     PRN Meds: sodium chloride, acetaminophen, morphine injection, nitroGLYCERIN, ondansetron (ZOFRAN) IV, sodium chloride flush   Vital Signs    Vitals:   03/28/22 2010 03/29/22 0008 03/29/22 0300 03/29/22 0808  BP: (!) 105/57 128/64 (!) 122/50 (!) 138/59  Pulse: 70 79 74 78  Resp: '20 20 20 18  '$ Temp: 98.1 F (36.7 C) 98.2 F (36.8 C) 97.7 F (36.5 C) 98.2 F (36.8 C)  TempSrc: Axillary Oral Oral Oral  SpO2: 96% 94% 92% 94%  Weight:      Height:        Intake/Output Summary (Last 24 hours) at 03/29/2022 0950 Last data filed at 03/29/2022 0130 Gross per 24 hour  Intake 1337.17 ml  Output --  Net 1337.17 ml      03/28/2022    1:08 PM 03/27/2022   12:58 AM 10/30/2014    1:52 PM  Last 3 Weights  Weight (lbs) 242 lb 8.1 oz 245 lb 243 lb 12.8 oz  Weight (kg) 110 kg 111.131 kg 110.587 kg      Telemetry    Sinus rhythm-personally reviewed  ECG    N/a - Personally Reviewed  Physical Exam   VS:  BP (!) 138/59 (BP Location: Right Leg)   Pulse 78   Temp 98.2 F (36.8 C) (Oral)   Resp 18   Ht '5\' 4"'$  (1.626 m)   Wt 110 kg   SpO2 94%   BMI 41.63 kg/m  , BMI Body mass index is 41.63 kg/m. GEN: Obese, in no acute distress  HEENT: normal  Neck: no JVD, carotid bruits, or masses Cardiac: RRR; no murmurs, rubs, or gallops,no edema  Respiratory:  clear to auscultation bilaterally, normal work of breathing GI: soft, nontender, nondistended, + BS MS: no deformity or  atrophy  Skin: warm and dry Neuro:  Strength and sensation are intact Psych: euthymic mood, full affect   Labs    High Sensitivity Troponin:   Recent Labs  Lab 03/27/22 0000 03/27/22 0210 03/27/22 0555 03/27/22 0745  TROPONINIHS 665* 805* 764* 694*      Chemistry Recent Labs  Lab 03/28/22 0506 03/28/22 1951 03/29/22 0207  NA 141 141 138  K 4.3 4.0 3.9  CL 108 111 112*  CO2 21* 20* 20*  GLUCOSE 112* 166* 92  BUN 24* 23 22  CREATININE 1.49* 1.68* 1.54*  CALCIUM 8.9 8.7* 8.3*  GFRNONAA 37* 32* 36*  ANIONGAP '12 10 6     '$ Lipids  Recent Labs  Lab 03/28/22 0506  CHOL 181  TRIG 141  HDL 42  LDLCALC 111*  CHOLHDL 4.3     Hematology Recent Labs  Lab 03/28/22 0506 03/28/22 1951 03/29/22 0207  WBC 7.5 7.3 6.4  RBC 4.38 4.01 3.86*  HGB 12.9 12.4 11.3*  HCT 41.3 36.2 35.2*  MCV 94.3 90.3 91.2  MCH 29.5 30.9 29.3  MCHC 31.2 34.3 32.1  RDW 12.7 12.7  12.8  PLT 151 136* 134*    Thyroid  Recent Labs  Lab 03/27/22 1423  TSH 3.565     BNPNo results for input(s): "BNP", "PROBNP" in the last 168 hours.  DDimer  Recent Labs  Lab 03/27/22 1423  DDIMER 1.35*      Radiology    DG CHEST PORT 1 VIEW  Result Date: 03/28/2022 CLINICAL DATA:  Shortness of breath EXAM: PORTABLE CHEST 1 VIEW COMPARISON:  Previous studies including the examination done earlier today FINDINGS: Transverse diameter of heart is increased. There are no signs of pulmonary edema or new focal infiltrates. Increased density in the lateral aspect of left lower lung fields may be related to enlarged heart. Left lateral CP angle is indistinct. There is no pneumothorax. IMPRESSION: Cardiomegaly. There are no signs of pulmonary edema or focal pulmonary consolidation. Left costophrenic angle is indistinct which may be related to cardiomegaly and prominent epicardial fat pad. Possibility of small left pleural effusion is not excluded. Electronically Signed   By: Elmer Picker M.D.   On:  03/28/2022 19:47   CARDIAC CATHETERIZATION  Result Date: 03/28/2022 1.  Patent left main with no significant stenosis 2.  Moderate ostial LAD stenosis of 50% with severe proximal to mid LAD stenosis of 90%, severe stenosis treated with a 2.75 x 16 mm Synergy DES 3.  Severe proximal left circumflex stenosis of 90%, treated with a 3.5 x 12 mm Synergy DES 4.  Mild diffuse nonobstructive RCA stenosis 5.  Normal LVEDP Recommendations: DAPT with aspirin and ticagrelor x12 months without interruption (ACS class I indication).  Post PCI hydration and patient with stage III CKD.  If clinically stable, okay for DC tomorrow.  Aggressive medical therapy for residual CAD. Total contrast = 85 cc   ECHOCARDIOGRAM COMPLETE  Result Date: 03/28/2022    ECHOCARDIOGRAM REPORT   Patient Name:   Madison Calhoun Date of Exam: 03/28/2022 Medical Rec #:  [id number]         Height:       64.0 in Accession #:    [id number]        Weight:       245.0 lb Date of Birth:  12-29-1950        BSA:          2.133 m Patient Age:    71 years          BP:           119/84 mmHg Patient Gender: F                 HR:           69 bpm. Exam Location:  Inpatient Procedure: 2D Echo, Cardiac Doppler and Color Doppler Indications:    Abnormal ECG R94.31  History:        Patient has no prior history of Echocardiogram examinations.                 Previous Myocardial Infarction; Risk Factors:Diabetes.  Sonographer:    Ronny Flurry Referring Phys: [id number] [place]  1. Difficult acoustic windows. LVEF is depressed with hypokinesis/akinesis of the distal anterior, distal inferior and apical walls. . Left ventricular ejection fraction, by estimation, is 45%. The left ventricular internal cavity size was mildly dilated. There is mild left ventricular hypertrophy.  2. Right ventricular systolic function is normal. The right ventricular size is normal.  3. The mitral valve is normal in structure. Trivial mitral valve regurgitation.  4.  The aortic valve is normal in structure. Aortic valve regurgitation is not visualized.  5. The inferior vena cava is normal in size with greater than 50% respiratory variability, suggesting right atrial pressure of 3 mmHg. FINDINGS  Left Ventricle: Difficult acoustic windows. LVEF is depressed with hypokinesis/akinesis of the distal anterior, distal inferior and apical walls. Left ventricular ejection fraction, by estimation, is 45%. The left ventricular internal cavity size was mildly dilated. There is mild left ventricular hypertrophy. Right Ventricle: The right ventricular size is normal. Right vetricular wall thickness was not assessed. Right ventricular systolic function is normal. Left Atrium: Left atrial size was normal in size. Right Atrium: Right atrial size was normal in size. Pericardium: There is no evidence of pericardial effusion. Mitral Valve: The mitral valve is normal in structure. Trivial mitral valve regurgitation. Tricuspid Valve: The tricuspid valve is normal in structure. Tricuspid valve regurgitation is trivial. Aortic Valve: The aortic valve is normal in structure. Aortic valve regurgitation is not visualized. Aortic valve mean gradient measures 3.0 mmHg. Aortic valve peak gradient measures 5.1 mmHg. Aortic valve area, by VTI measures 3.12 cm. Pulmonic Valve: The pulmonic valve was normal in structure. Pulmonic valve regurgitation is not visualized. Aorta: The aortic root is normal in size and structure. Venous: The inferior vena cava is normal in size with greater than 50% respiratory variability, suggesting right atrial pressure of 3 mmHg. IAS/Shunts: No atrial level shunt detected by color flow Doppler.  LEFT VENTRICLE PLAX 2D LVIDd:         5.30 cm   Diastology LVIDs:         3.80 cm   LV e' medial:    4.35 cm/s LV PW:         1.20 cm   LV E/e' medial:  11.8 LV IVS:        1.10 cm   LV e' lateral:   4.82 cm/s LVOT diam:     2.20 cm   LV E/e' lateral: 10.7 LV SV:         76 LV SV Index:    35 LVOT Area:     3.80 cm  RIGHT VENTRICLE RV S prime:     15.40 cm/s TAPSE (M-mode): 2.0 cm LEFT ATRIUM             Index        RIGHT ATRIUM           Index LA diam:        2.90 cm 1.36 cm/m   RA Area:     12.80 cm LA Vol (A2C):   38.8 ml 18.19 ml/m  RA Volume:   22.70 ml  10.64 ml/m LA Vol (A4C):   35.5 ml 16.65 ml/m LA Biplane Vol: 37.4 ml 17.54 ml/m  AORTIC VALVE AV Area (Vmax):    2.97 cm AV Area (Vmean):   2.93 cm AV Area (VTI):     3.12 cm AV Vmax:           113.00 cm/s AV Vmean:          77.550 cm/s AV VTI:            0.242 m AV Peak Grad:      5.1 mmHg AV Mean Grad:      3.0 mmHg LVOT Vmax:         88.30 cm/s LVOT Vmean:        59.700 cm/s LVOT VTI:          0.199  m LVOT/AV VTI ratio: 0.82  AORTA Ao Root diam: 3.90 cm Ao Asc diam:  3.70 cm MITRAL VALVE MV Area (PHT): 3.53 cm    SHUNTS MV Decel Time: 215 msec    Systemic VTI:  0.20 m MV E velocity: 51.40 cm/s  Systemic Diam: 2.20 cm MV A velocity: 75.70 cm/s MV E/A ratio:  0.68 Dorris Carnes MD Electronically signed by Dorris Carnes MD Signature Date/Time: 03/28/2022/2:08:13 PM    Final    NM Pulmonary Perfusion  Result Date: 03/28/2022 CLINICAL DATA:  71 year old female with chest pain and shortness of breath. EXAM: NUCLEAR MEDICINE PERFUSION LUNG SCAN TECHNIQUE: Perfusion images were obtained in multiple projections after intravenous injection of radiopharmaceutical. Ventilation scans intentionally deferred if perfusion scan and chest x-ray adequate for interpretation during COVID 19 epidemic. RADIOPHARMACEUTICALS:  4.3 mCi Tc-42mMAA IV COMPARISON:  Portable chest 0814 hours today. FINDINGS: Homogeneous perfusion radiotracer activity in both lungs. Mediastinal photopenia corresponding to the portable chest. No pulmonary perfusion defect. IMPRESSION: No evidence of pulmonary embolus. Electronically Signed   By: HGenevie AnnM.D.   On: 03/28/2022 12:36   DG Chest Portable 1 View  Result Date: 03/28/2022 CLINICAL DATA:  Before V/Q scan.  Chest  pain. EXAM: PORTABLE CHEST 1 VIEW COMPARISON:  Chest radiograph 03/27/2019 FINDINGS: No pleural effusion. No pneumothorax. Unchanged cardiac and mediastinal contours. Previously seen streaky opacities in the right middle lobe are no longer visualized. No displaced rib fractures. Visualized upper abdomen is unremarkable. Unchanged metallic surgical hardware projecting over the cervical spine. IMPRESSION: No acute cardiopulmonary disease. Previously seen streaky opacities in the right middle lobe are less conspicuous and favored to represent atelectasis Electronically Signed   By: HMarin RobertsM.D.   On: 03/28/2022 08:28    Cardiac Studies   V/Q scan: Negative for PE  Echo pending  LHC pending  Patient Profile     Ms. KKarowis a 65F with morbid obesity and pre-diabetes admitted with back pain and elevated troponin.    Assessment & Plan   1.  Non-STEMI: Troponin elevation to 805.  Was found on catheterization to have LAD and circumflex 90% stenosis post stenting.  We Raye Slyter plan for discharge today.  Ladonte Verstraete need aspirin and Brilinta for 1 year.  Continue carvedilol and atorvastatin at discharge.    For questions or updates, please contact CSierra VillagePlease consult [email or web address] for contact info under        Signed, Aireana Ryland MMeredith Leeds MD  03/29/2022, 9:50 AM

## 2022-03-29 NOTE — Discharge Instructions (Signed)
DO NOT miss a dose of Brilinta or Aspirin. Your pharmacy only had 60 days of Brilinta but will order refills. If you need 90 day prescriptions, let the provider know at your follow up visit.

## 2022-03-29 NOTE — Progress Notes (Addendum)
CARDIAC REHAB PHASE I   PRE:  Rate/Rhythm: 85 NSR  BP:  Sitting: 138/59      SaO2: 95 RA  MODE:  Ambulation: 240 ft   POST:  Rate/Rhythm: 107 ST  BP:  Sitting: 117/94      SaO2: 94 RA  Pt was ambulated through hallway with standby assistance. Pt c/p of sob 5/10 during our walk and needed 3 standing rest breaks before we could return to room. Pt was educated on stent location, asa and birlinta, restrictions, risk factors, heart healthy diet ex guidelines, ntg use,and CRPII. Pt received MI book and materials. Pt will be referred to [facility]   Christen Bame  8:54 AM 03/29/2022

## 2022-03-29 NOTE — TOC CM/SW Note (Signed)
Provided Brilinta pharmacy discount card to patient prior to discharge. Denies having any further TOC needs. Nephew is transporting home today.  Marthenia Rolling, MSN, RN,BSN [facility]patient [facility] Case Manager [phone]

## 2022-03-29 NOTE — Discharge Summary (Cosign Needed)
Discharge Summary    Patient ID: ELEXA KIVI MRN: [id number]; DOB: Dec 13, 1950  Admit date: 03/26/2022 Discharge date: 03/29/2022  PCP:  Pleas Koch, NP   [place] Providers Cardiologist:  Glenetta Hew, MD        Discharge Diagnoses    Principal Problem:   NSTEMI (non-ST elevated myocardial infarction) [facility]) Active Problems:   CAD (coronary artery disease)   Ischemic cardiomyopathy   Hyperlipidemia LDL goal <70   CKD (chronic kidney disease) stage 3, GFR 30-59 ml/min ([place])   Pre-diabetes    Diagnostic Studies/Procedures    LEFT HEART CATH AND CORONARY ANGIOGRAPHY, LEFT HEART CATH AND CORONARY ANGIOGRAPHY 03/28/2022 Narrative 1.  Patent left main with no significant stenosis 2.  Moderate ostial LAD stenosis of 50% with severe proximal to mid LAD stenosis of 90%, severe stenosis treated with a 2.75 x 16 mm Synergy DES 3.  Severe proximal left circumflex stenosis of 90%, treated with a 3.5 x 12 mm Synergy DES 4.  Mild diffuse nonobstructive RCA stenosis 5.  Normal LVEDP Recommendations: DAPT with aspirin and ticagrelor x12 months without interruption (ACS class I indication).  Post PCI hydration and patient with stage III CKD.  If clinically stable, okay for DC tomorrow.  Aggressive medical therapy for residual CAD.       ECHO COMPLETE WO IMAGING ENHANCING AGENT 03/28/2022 IMPRESSIONS 1. Difficult acoustic windows. LVEF is depressed with hypokinesis/akinesis of the distal anterior, distal inferior and apical walls. . Left ventricular ejection fraction, by estimation, is 45%. The left ventricular internal cavity size was mildly dilated. There is mild left ventricular hypertrophy. 2. Right ventricular systolic function is normal. The right ventricular size is normal. 3. The mitral valve is normal in structure. Trivial mitral valve regurgitation. 4. The aortic valve is normal in structure. Aortic valve regurgitation is not visualized. 5. The  inferior vena cava is normal in size with greater than 50% respiratory variability, suggesting right atrial pressure of 3 mmHg.    _____________   History of Present Illness     ERVA KOKE is a 71 y.o. female with pre-diabetes and morbid obesity, thyroid disease, arthritis. She presented to the hospital on 03/27/22 with sudden onset of mid thoracic back pain while eating at a restaurant. She drove home but was subsequently brought to [facility] by a family member. He hsTrops were mildly elevated but flat ([phone] - [phone] - [phone] - [phone]). SCr was elevated at 1.47. She was started on IV heparin and IVFs. 2D echocardiogram and DDimer were ordered. She was admitted for further evaluation and management.   Hospital Course     Consultants: None    NSTEMI  As noted, the patient was admitted for further evaluation and management. Her DDimer was mildly elevated. VQ scan was neg for pulmonary embolism. Echocardiogram demonstrated EF 45 with dist ant, dist inf and apical HK/AK. She was set up for cardiac catheterization. This demonstrated severe pLAD stenosis of 90% and severe pLCx stenosis of 90%. She underwent PCI with DES to the pLAD and DES to the pLCx. DAPT with ASA and Brilinta is recommended for 12 mos without interruption. After coming to the floor, she did develop a small hematoma at her radial site which was treated with manual pressure. Of note, her pharmacy only had #60 Brilinta tablets. Therefore, Rx written for 30 days with 11 refills. This can be changed to 90 days if needed at f/u.   Chronic kidney disease  Her creatinine remained stable  throughout admission (1.47 - 1.49 - 1.68 - 1.54)  Hyperlipidemia  The patient was started on Lipitor 40 mg once daily. She will need f/u labs in 6-8 weeks.   Pre-Diabetes A1c this admission was 5.9  Disposition The patient was seen by Dr. Curt Bears this morning. She is doing well and is felt to be ready for DC to home.      Did the patient have  an acute coronary syndrome (MI, NSTEMI, STEMI, etc) this admission?:  Yes                               AHA/ACC Clinical Performance & Quality Measures: Aspirin prescribed? - Yes ADP Receptor Inhibitor (Plavix/Clopidogrel, Brilinta/Ticagrelor or Effient/Prasugrel) prescribed (includes medically managed patients)? - Yes Beta Blocker prescribed? - Yes High Intensity Statin (Lipitor 40-80mg  or Crestor 20-40mg ) prescribed? - Yes EF assessed during THIS hospitalization? - Yes For EF <40%, was ACEI/ARB prescribed? - Not Applicable (EF >/= 24%) For EF <40%, Aldosterone Antagonist (Spironolactone or Eplerenone) prescribed? - Not Applicable (EF >/= 82%) Cardiac Rehab Phase II ordered (including medically managed patients)? - Yes       The patient will be scheduled for a TOC follow up appointment in 14 days.  A message has been sent to the [facility] and Scheduling Pool at the office where the patient should be seen for follow up.  _____________  Discharge Vitals Blood pressure (!) 138/59, pulse 78, temperature 98.2 F (36.8 C), temperature source Oral, resp. rate 18, height 5\' 4"  (1.626 m), weight 110 kg, SpO2 94 %.  Filed Weights   03/27/22 0058 03/28/22 1308  Weight: 111.1 kg 110 kg    Labs & Radiologic Studies    CBC Recent Labs    03/27/22 0000 03/28/22 0506 03/28/22 1951 03/29/22 0207  WBC 8.3   < > 7.3 6.4  NEUTROABS 6.2  --   --   --   HGB 13.9   < > 12.4 11.3*  HCT 43.1   < > 36.2 35.2*  MCV 92.1   < > 90.3 91.2  PLT 193   < > 136* 134*   < > = values in this interval not displayed.   Basic Metabolic Panel Recent Labs    03/28/22 1951 03/29/22 0207  NA 141 138  K 4.0 3.9  CL 111 112*  CO2 20* 20*  GLUCOSE 166* 92  BUN 23 22  CREATININE 1.68* 1.54*  CALCIUM 8.7* 8.3*    High Sensitivity Troponin:   Recent Labs  Lab 03/27/22 0000 03/27/22 0210 03/27/22 0555 03/27/22 0745  TROPONINIHS 665* 805* 764* 694*     D-Dimer Recent Labs    03/27/22 1423  DDIMER  1.35*   Hemoglobin A1C Recent Labs    03/27/22 1423  HGBA1C 5.9*   Fasting Lipid Panel Recent Labs    03/28/22 0506  CHOL 181  HDL 42  LDLCALC 111*  TRIG 141  CHOLHDL 4.3   Thyroid Function Tests Recent Labs    03/27/22 1423  TSH 3.565   _____________  DG CHEST PORT 1 VIEW  Result Date: 03/28/2022 CLINICAL DATA:  Shortness of breath EXAM: PORTABLE CHEST 1 VIEW COMPARISON:  Previous studies including the examination done earlier today FINDINGS: Transverse diameter of heart is increased. There are no signs of pulmonary edema or new focal infiltrates. Increased density in the lateral aspect of left lower lung fields may be related to enlarged heart. Left lateral  CP angle is indistinct. There is no pneumothorax.  IMPRESSION:  Cardiomegaly. There are no signs of pulmonary edema or focal pulmonary consolidation. Left costophrenic angle is indistinct which may be related to cardiomegaly and prominent epicardial fat pad. Possibility of small left pleural effusion is not excluded.  Electronically Signed   By: Elmer Picker M.D.   On: 03/28/2022 19:47    NM Pulmonary Perfusion  Result Date: 03/28/2022 CLINICAL DATA:  71 year old female with chest pain and shortness of breath. EXAM: NUCLEAR MEDICINE PERFUSION LUNG SCAN TECHNIQUE: Perfusion images were obtained in multiple projections after intravenous injection of radiopharmaceutical. Ventilation scans intentionally deferred if perfusion scan and chest x-ray adequate for interpretation during COVID 19 epidemic. RADIOPHARMACEUTICALS:  4.3 mCi Tc-37m MAA IV COMPARISON:  Portable chest 0814 hours today. FINDINGS: Homogeneous perfusion radiotracer activity in both lungs. Mediastinal photopenia corresponding to the portable chest. No pulmonary perfusion defect.  IMPRESSION:  No evidence of pulmonary embolus.  Electronically Signed   By: Genevie Ann M.D.   On: 03/28/2022 12:36    DG Chest Portable 1 View  Result Date: 03/28/2022 CLINICAL  DATA:  Before V/Q scan.  Chest pain. EXAM: PORTABLE CHEST 1 VIEW COMPARISON:  Chest radiograph 03/27/2019 FINDINGS: No pleural effusion. No pneumothorax. Unchanged cardiac and mediastinal contours. Previously seen streaky opacities in the right middle lobe are no longer visualized. No displaced rib fractures. Visualized upper abdomen is unremarkable. Unchanged metallic surgical hardware projecting over the cervical spine.  IMPRESSION:  No acute cardiopulmonary disease. Previously seen streaky opacities in the right middle lobe are less conspicuous and favored to represent atelectasis  Electronically Signed   By: Marin Roberts M.D.   On: 03/28/2022 08:28    DG Chest 2 View  Result Date: 03/26/2022 CLINICAL DATA:  Chest pain. EXAM: CHEST - 2 VIEW COMPARISON:  None Available. FINDINGS: Upper normal heart size. Aortic atherosclerosis and tortuosity. There are streaky opacities in the right infrahilar lung, possibly localizing to the right middle lobe on lateral view. Mild bronchial thickening. No pleural effusion or pneumothorax. No acute osseous findings.  IMPRESSION:  1. Streaky right middle lobe opacities may be atelectasis or pneumonia. Mild bronchial thickening.  2. Upper normal heart size with aortic atherosclerosis and tortuosity.  Electronically Signed   By: Keith Rake M.D.   On: 03/26/2022 23:43   Disposition   Pt is being discharged home today in good condition.  Follow-up Plans & Appointments     Follow-up Information     Leonie Man, MD Follow up in 2 week(s).   Specialty: Cardiology Why: The office will call to arrange a follow up with Dr. Ellyn Hack or one of the PAs or NPs. Contact information: [address] [address] [phone]                Discharge Instructions     Amb Referral to Cardiac Rehabilitation   Complete by: As directed    Diagnosis:  Coronary Stents NSTEMI     After initial evaluation and assessments  completed: Virtual Based Care may be provided alone or in conjunction with Phase 2 Cardiac Rehab based on patient barriers.: Yes   Intensive Cardiac Rehabilitation (ICR) [place] location only OR Traditional Cardiac Rehabilitation (TCR) *If criteria for ICR are not met will enroll in TCR [facility] only): Yes   Diet - low sodium heart healthy   Complete by: As directed    Discharge wound care:   Complete by: As directed    Call  our office for any swelling, bleeding, bruising or fever.   Driving Restrictions   Complete by: As directed    None for 1 week   Increase activity slowly   Complete by: As directed    Lifting restrictions   Complete by: As directed    No lifting over 5 lbs for 2 weeks   Sexual Activity Restrictions   Complete by: As directed    None for 2 weeks        Discharge Medications   Allergies as of 03/29/2022   No Known Allergies      Medication List     STOP taking these medications    docusate sodium 100 MG capsule Commonly known as: COLACE   HYDROcodone-acetaminophen 5-325 MG tablet Commonly known as: Norco   naproxen sodium 220 MG tablet Commonly known as: ALEVE       TAKE these medications    aspirin EC 81 MG tablet Take 1 tablet (81 mg total) by mouth daily. Swallow whole. Start taking on: March 30, 2022 What changed:  medication strength how much to take additional instructions   atorvastatin 40 MG tablet Commonly known as: LIPITOR Take 1 tablet (40 mg total) by mouth every evening.   carvedilol 6.25 MG tablet Commonly known as: COREG Take 1 tablet (6.25 mg total) by mouth 2 (two) times daily with a meal.   nitroGLYCERIN 0.4 MG SL tablet Commonly known as: NITROSTAT Place 1 tablet (0.4 mg total) under the tongue every 5 (five) minutes x 3 doses as needed for chest pain.   ticagrelor 90 MG Tabs tablet Commonly known as: BRILINTA Take 1 tablet (90 mg total) by mouth 2 (two) times daily.               Discharge Care  Instructions  (From admission, onward)           Start     Ordered   03/29/22 0000  Discharge wound care:       Comments: Call our office for any swelling, bleeding, bruising or fever.   03/29/22 1055               Outstanding Labs/Studies   Arrange fasting Lipids and LFTs in 6-8 weeks at f/u OV  Duration of Discharge Encounter   Greater than 30 minutes including physician time.  Signed, Richardson Dopp, PA-C 03/29/2022, 11:03 AM  I have seen and examined this patient with Richardson Dopp.  Agree with above, note added to reflect my findings.  Patient admitted to the hospital with atypical chest pain.  Was found to have LAD and circumflex stenosis.  Received drug-eluting stents to both of these vessels.  Plan for discharge today with follow-up in clinic.  GEN: Well nourished, well developed, in no acute distress  HEENT: normal  Neck: no JVD, carotid bruits, or masses Cardiac: RRR; no murmurs, rubs, or gallops,no edema  Respiratory:  clear to auscultation bilaterally, normal work of breathing GI: soft, nontender, nondistended, + BS MS: no deformity or atrophy  Skin: warm and dry Neuro:  Strength and sensation are intact Psych: euthymic mood, full affect    Will M. Camnitz MD 03/30/2022 7:47 AM

## 2022-03-31 ENCOUNTER — Encounter (HOSPITAL_COMMUNITY): Payer: Self-pay | Admitting: Cardiovascular Disease

## 2022-03-31 ENCOUNTER — Telehealth: Payer: Self-pay

## 2022-03-31 NOTE — Telephone Encounter (Signed)
-----   Message from Liliane Shi, [place] sent at 03/29/2022 11:03 AM EDT ----- Regarding: Hosp FU Primary Cardiologist:  Ellyn Hack DC from hospital on 03/29/2022  Please arrange FU in 2 weeks with Ellyn Hack or APP. This is a TCM appointment. Signed,  Richardson Dopp, PA-C   03/29/2022 11:03 AM

## 2022-04-01 ENCOUNTER — Telehealth: Payer: Self-pay

## 2022-04-01 NOTE — Telephone Encounter (Signed)
Transition Care Management Follow-up Telephone Call Date of discharge and from where: 03/29/2022 from [facility]  How have you been since you were released from the hospital? Doing ok after getting home  Any questions or concerns? Yes  Patient has had back pain not sure what she can take for it with new problems.   Items Reviewed: Did the pt receive and understand the discharge instructions provided? Yes  Medications obtained and verified? No  Other? No  Any new allergies since your discharge? No  Dietary orders reviewed? No Do you have support at home? No   Home Care and Equipment/Supplies: Were home health services ordered? no If so, what is the name of the agency? N/A   Has the agency set up a time to come to the patient's home? no Were any new equipment or medical supplies ordered?  No What is the name of the medical supply agency? N/A  Were you able to get the supplies/equipment? not applicable Do you have any questions related to the use of the equipment or supplies? No  Functional Questionnaire: (I = Independent and D = Dependent) ADLs: Patient is still limited in activities   Bathing/Dressing- I  Meal Prep- D   Eating- I  Maintaining continence- I  Transferring/Ambulation- I  Managing Meds- I  Follow up appointments reviewed:  PCP Hospital f/u appt confirmed? No  Patient has not been seen in 7 yrs at out office Anda Kraft is no longer her PCP I have removed as PCP and advised that she call Dr. Ellyn Hack for recommendation on back pain.  [facility] f/u appt confirmed? Yes  Scheduled to see Dr. Ellyn Hack  on 04/17/2022 . Are transportation arrangements needed? No  If their condition worsens, is the pt aware to call PCP or go to the Emergency Dept.? Yes Was the patient provided with contact information for the PCP's office or ED? Yes Was to pt encouraged to call back with questions or concerns? No Patient advised to call new pcp/cardiology or go to ED with and  symptoms/problems.

## 2022-04-03 ENCOUNTER — Telehealth: Payer: Self-pay | Admitting: Cardiology

## 2022-04-03 NOTE — Telephone Encounter (Signed)
I spoke with the patient. I have confirmed that she is not noticing anything other than some mild soreness to her cath site- denies redness, drainage, heat to the area.   I have advised the patient she may take tylenol/ extra strength tylenol for arm discomfort.  The patient voices understanding and is agreeable. She was appreciative of the call back.

## 2022-04-03 NOTE — Telephone Encounter (Signed)
Patient contacted regarding discharge from [facility] on 03/29/22.  Patient understands to follow up with Dr Ellyn Hack on 04/17/22 at 8:40 am at [facility]. Patient understands discharge instructions? yes  Patient understands medications and regiment? yes Patient understands to bring all medications to this visit? yes

## 2022-04-03 NOTE — Telephone Encounter (Signed)
Patient states her right arm is hurting her she wants to know what she can take.  She states she had a stent placed the other day.

## 2022-04-17 ENCOUNTER — Encounter: Payer: Self-pay | Admitting: Cardiology

## 2022-04-17 ENCOUNTER — Ambulatory Visit: Payer: Medicare Other | Attending: Cardiology | Admitting: Cardiology

## 2022-04-17 VITALS — BP 110/80 | HR 77 | Ht 64.0 in | Wt 235.2 lb

## 2022-04-17 DIAGNOSIS — E785 Hyperlipidemia, unspecified: Secondary | ICD-10-CM | POA: Diagnosis not present

## 2022-04-17 DIAGNOSIS — N1832 Chronic kidney disease, stage 3b: Secondary | ICD-10-CM | POA: Insufficient documentation

## 2022-04-17 DIAGNOSIS — I214 Non-ST elevation (NSTEMI) myocardial infarction: Secondary | ICD-10-CM | POA: Insufficient documentation

## 2022-04-17 DIAGNOSIS — I255 Ischemic cardiomyopathy: Secondary | ICD-10-CM | POA: Insufficient documentation

## 2022-04-17 DIAGNOSIS — I2510 Atherosclerotic heart disease of native coronary artery without angina pectoris: Secondary | ICD-10-CM | POA: Insufficient documentation

## 2022-04-17 DIAGNOSIS — R7303 Prediabetes: Secondary | ICD-10-CM | POA: Diagnosis not present

## 2022-04-17 DIAGNOSIS — I21A9 Other myocardial infarction type: Secondary | ICD-10-CM | POA: Insufficient documentation

## 2022-04-17 DIAGNOSIS — Z9861 Coronary angioplasty status: Secondary | ICD-10-CM | POA: Diagnosis not present

## 2022-04-17 MED ORDER — PRASUGREL HCL 10 MG PO TABS: 10.0000 mg | ORAL_TABLET | Freq: Every day | ORAL | 11 refills | Status: DC

## 2022-04-17 MED ORDER — CARVEDILOL 6.25 MG PO TABS: ORAL_TABLET | ORAL | 11 refills | Status: DC

## 2022-04-17 NOTE — Progress Notes (Signed)
Primary Care Provider: Kathyrn Lass Calhoun Cardiologist: Madison Hew, MD Electrophysiologist: None  Clinic Note: Chief Complaint  Patient presents with   Hospitalization Follow-up    Patient c/o shortness of breath with little exertion. Medications reviewed by the patient verbally.    Coronary Artery Disease    Non-STEMI-PCI to LAD and LCx.   ===================================  ASSESSMENT/PLAN   Problem List Items Addressed This Visit       Cardiology Problems   CAD S/P 2 Vessel DES PCI (prox-mid LAD & prox LCx) - Primary (Chronic)    Proximal LAD and LCx PCI in setting of non-STEMI.  We will plan DAPT for at least a year.  Thankfully, not having any angina.  She is just not active. Need to refer to cardiac rehab to increase level of exercise and adjust diet.  Plan: Continue carvedilol, but systems were more energy during the day we will reduce daytime dose to 3.25 mg and continue 625 mg at night. Continue atorvastatin Continue DAPT: Uninterrupted. Financial issues with Brilinta, will convert to Effient to complete 1 year DAPT For urgent procedures in the first 6 months, would need bridging with IV antiplatelet agent preoperatively After 6 months, (late April 2024) would be okay to hold for urgent-Brilinta is held 9 days preop 6 months out-late April 2024, if bleeding or bruising is significant, can stop aspirin 1 year out (end of October 2024)-would DC aspirin and switch to Plavix 75 mg monotherapy to complete second year.      Relevant Medications   carvedilol (COREG) 6.25 MG tablet   Other Relevant Orders   EKG 12-Lead (Completed)   Lipid Profile   Comp Met (CMET)   HgB A1c   NSTEMI (non-ST elevated myocardial infarction) (HCC) (Chronic)   Relevant Medications   carvedilol (COREG) 6.25 MG tablet   Other Relevant Orders   EKG 12-Lead (Completed)   Comp Met (CMET)   HgB A1c   Ischemic cardiomyopathy (Chronic)    EF is down little bit at 45%.   Unfortunate her blood pressure is little bit low, so we are not able to titrate GDMT much further. In fact, because of exercise intolerance, fatigue and some dizziness, back off her daytime dose of carvedilol to 1/2 tablet.  I do not think she be able to tolerate afterload reduction although will consider low-dose losartan follow-up visit.      Relevant Medications   carvedilol (COREG) 6.25 MG tablet   Other Relevant Orders   EKG 12-Lead (Completed)   Comp Met (CMET)   Hyperlipidemia LDL goal <70 (Chronic)    Started on modest dose Lipitor 40 mg in the hospital.  LDL was 111 at that time.  She needs follow-up labs in roughly January timeframe.-Lipids, c-Met, A1c-we will also try to add LP(a)      Relevant Medications   carvedilol (COREG) 6.25 MG tablet   Other Relevant Orders   EKG 12-Lead (Completed)   Lipid Profile   HgB A1c     Other   CKD (chronic kidney disease) stage 3, GFR 30-59 ml/min (HCC) (Chronic)    For follow-up labs, we will check a full CMP as opposed to simply doing LFTs.  Need to monitor renal function.      Relevant Orders   Comp Met (CMET)   Pre-diabetes (Chronic)    A1c was 5.9.  Not at the level that would allow [place] to use Ozempic or Mounjaro for weight loss however could consider Wegovy in the future.  At present not requiring any medication, although could consider SGLT2 in the      Morbid obesity ([place]) (Chronic)    Counseled on the importance of dietary modification and try to get more exercise.  Cardiac rehab referral.  Hopefully with some exercise and some education about diet, she may gain some ground.      Relevant Orders   EKG 12-Lead (Completed)   Other Visit Diagnoses     Other myocardial infarction type ([place])       Relevant Medications   carvedilol (COREG) 6.25 MG tablet   Other Relevant Orders   HgB A1c       ===================================  HPI:    Madison Calhoun is a 71 y.o. female who is being seen today for the  Posthospital Follow-Up-  admitted for non-STEMI-LAD and LCx PCI.  She presents at the request of Madison Koch, NP  Madison Calhoun was seen in consultation by Madison Latch, MD on 03/27/2022 when she presented to [facility] with chest pain and positive troponins.  There was concern for possible PE related to D-dimer.  However that was negative therefore she was considered to be non-STEMI => with a baseline creatinine of 1.47 on admission, she was hydrated and posted for catheterization following day revealing three-vessel disease.  Underwent two-vessel PCI.  Echo showed mildly reduced EF consistent with wall motion abnormalities involving either one of the 2 vessels distribution.  She was discharged on October 21.  => Brother Madison Calhoun was my patient - died after long fight with CAD/ESRD.   Her other brother Madison Calhoun also died @ Madison Calhoun.   Recent Hospitalizations: See above  Reviewed  CV studies:    The following studies were reviewed today: (if available, images/films reviewed: From Epic Chart or Care Everywhere) Cardiac Cath-PCI 03/28/2022: (Non-STEMI): Ost LAD ~50% w/ severe prox-mid LAD ~90% (DES PCI -Synergy XD 2.75 x 16 -> 3.0 mm); Prox LCx 90% (DES PCI - Synergy XD 3.5 x 12 -> 4.0 mm); mild /nonobstructive disease in RCA.  Normal LVEDP.       TTE 03/28/2022: (Non-STEMI) EF estimated 45% with distal anterior, inferior and apical hypo to akinesis.  Normal valves.  Normal RV.  Normal RAP.  Interval History:   Madison Calhoun presents for Hospital f/u after MI.  Can't affort Brilinta. => Probably need to convert to Plavix.  Angina was pain btw shoulders & up neck to nape of head.--Has not had any further episodes.  Madison Calhoun is a sister of a long-term patient of mine who passed away couple of Christmas's ago.Marland Kitchen  Unfortunately, she has become very sedentary with minimal activity even before her MI.  She does not do much at all around the house.  She seems to  do okay from a clinical standpoint since her discharge with no further symptoms that led to her hospitalization.  Has not yet taken steps to try to increase her fitness level.  Gets really tired when she over does it -- has to go rest.  Was never very active - usually just sits down.  Still recovering from her ankle fxr back in May.  Really it is her deconditioning that is limiting most of her activity. Occasional dizziness / vertigo when lying down.   No angina or heart failure symptoms.  Stable exertional dyspnea. Just notes fatigue and exercise intolerance.  Orthostatic dizziness  CV Review of Symptoms (Summary): Cardiovascular ROS: positive for - dyspnea on exertion, orthopnea, and exercise  intolerance , fatigue negative for - chest pain, edema, irregular heartbeat, palpitations, paroxysmal nocturnal dyspnea, rapid heart rate, shortness of breath, or syncope, near syncope, TIA/ amaurosis fugax. Claudication; melena, hematochezia, hematuria, epistaxis.   REVIEWED OF SYSTEMS   Review of Systems  Constitutional:  Positive for malaise/fatigue. Negative for weight loss.  HENT:  Negative for congestion.   Respiratory:  Positive for cough and shortness of breath.        Per HPI  Gastrointestinal:  Negative for blood in stool and melena.  Genitourinary:  Negative for hematuria.  Musculoskeletal:  Positive for joint pain (R ankle). Negative for back pain and neck pain.  Neurological:  Positive for dizziness (sometimes when lying down.). Negative for focal weakness.  Psychiatric/Behavioral:  Negative for depression and memory loss. The patient is not nervous/anxious and does not have insomnia.   All other systems reviewed and are negative.  I have reviewed and (if needed) personally updated the patient's problem list, medications, allergies, past medical and surgical history, social and family history.   PAST MEDICAL HISTORY   Past Medical History:  Diagnosis Date   Arthritis    CAD S/P  percutaneous coronary intervention-the PCI to LAD and LCx 03/29/2022   S/p NSTEMI 03/2022 >> s/p 2.75 x 16 mm DES to pLAD and 3.5 x 12 mm DES to pLCx   Chicken pox    CKD (chronic kidney disease) stage 3, GFR 30-59 ml/min (HCC) 03/29/2022   Glaucoma    Hyperlipidemia LDL goal <70 03/29/2022   Ischemic cardiomyopathy 03/29/2022   S/p NSTEMI in 03/2022 // Echocardiogram 03/2022: EF 45, dist ant, dis inf and apical HK/AK (diff acoustic windows), mild LVH, normal RVSF, trivial MR, RAP 3   Non-STEMI (non-ST elevated myocardial infarction) ([place]) 03/27/2022   Thyroid disease     PAST SURGICAL HISTORY   Past Surgical History:  Procedure Laterality Date   BUNIONECTOMY     CATARACT EXTRACTION W/ INTRAOCULAR LENS IMPLANT Bilateral    CORONARY STENT INTERVENTION N/A 03/28/2022   Procedure: CORONARY STENT INTERVENTION;  Surgeon: Sherren Mocha, MD;  Location: [facility];  Service: CV: Proximal-mid LAD 90% (DES PCI-Synergy XD 2.75 x 16 -> 3.0 mm postdilation); proximal LCx 90% (DES PCI-Synergy XD 3.5 x 12 -> 4.0 mm). => Both lesions reduced to 0% with TIMI-3 flow preserved.   GALLBLADDER SURGERY  06/09/1988   GLAUCOMA SURGERY     LEFT HEART CATH AND CORONARY ANGIOGRAPHY N/A 03/28/2022   Procedure: LEFT HEART CATH AND CORONARY ANGIOGRAPHY;  Surgeon: Sherren Mocha, MD;  Location: [facility];  Service: CV: Non-STEMI: Ostial LAD 50%, proximal-mid LAD 90% (DES PCI); proximal LCx 90% (DES PCI).  Mild nonobstructive disease in the RCA.  Normal LV function-normal LVEDP.   SPINE SURGERY     TRANSTHORACIC ECHOCARDIOGRAM  03/28/2022   (Non-STEMI) EF estimated 45% with distal anterior, inferior and apical hypo to akinesis.  Normal valves.  Normal RV.  Normal RAP.    Immunization History  Administered Date(s) Administered   Tdap 10/30/2014    MEDICATIONS/ALLERGIES   Current Meds  Medication Sig   aspirin EC 81 MG tablet Take 1 tablet (81 mg total) by mouth daily. Swallow whole.    atorvastatin (LIPITOR) 40 MG tablet Take 1 tablet (40 mg total) by mouth every evening.   carvedilol (COREG) 6.25 MG tablet Take 1 tablet (6.25 mg total) by mouth 2 (two) times daily with a meal.   nitroGLYCERIN (NITROSTAT) 0.4 MG SL tablet Place 1 tablet (0.4  mg total) under the tongue every 5 (five) minutes x 3 doses as needed for chest pain.   ticagrelor (BRILINTA) 90 MG TABS tablet Take 1 tablet (90 mg total) by mouth 2 (two) times daily.    No Known Allergies  SOCIAL HISTORY/FAMILY HISTORY   Reviewed in Epic:   Social History   Tobacco Use   Smoking status: Former    Types: Cigarettes    Quit date: 04/02/1976    Years since quitting: 46.1  Vaping Use   Vaping Use: Never used  Substance Use Topics   Alcohol use: No    Alcohol/week: 0.0 standard drinks of alcohol   Drug use: No   Social History   Social History Narrative   Single.   Twin children.   Retired. Worked as a Dealer.   Enjoys playing computer games, plays with her dogs.      Her brother was Sonia Side -> long-term patient of Dr. Ellyn Hack who passed away Christmas a couple years ago.   Family History  Problem Relation Age of Onset   Alcohol abuse Mother    Cancer Mother        Uterine   Heart disease Mother    Mental illness Mother        Alzheimers   Hyperlipidemia Mother    Hypertension Mother    Kidney disease Mother    Alcohol abuse Father    Heart disease Father    Heart disease Brother    Arthritis Brother    Hyperlipidemia Brother    Hypertension Brother    Kidney disease Brother    Heart disease Brother    Hyperlipidemia Brother    Hypertension Brother     OBJCTIVE -PE, EKG, labs   Wt Readings from Last 3 Encounters:  04/17/22 235 lb 4 oz (106.7 kg)  03/28/22 242 lb 8.1 oz (110 kg)  10/30/14 243 lb 12.8 oz (110.6 kg)  Physical Exam: BP 110/80 (BP Location: Left Arm, Patient Position: Sitting, Cuff Size: Large)   Pulse 77   Ht 5' 4" (1.626 m)   Wt 235 lb 4 oz (106.7  kg)   SpO2 97%   BMI 40.38 kg/m  Physical Exam Vitals reviewed.  Constitutional:      Appearance: She is obese.     Comments: Morbidly obese.  Well-groomed.  HENT:     Head: Normocephalic and atraumatic.  Neck:     Vascular: No carotid bruit or JVD.  Cardiovascular:     Rate and Rhythm: Normal rate and regular rhythm. No extrasystoles are present.    Chest Wall: PMI is not displaced (Very difficult to palpate.).     Pulses: Intact distal pulses. Decreased pulses (Decreased due to body habitus.  But palpable.).     Heart sounds: S1 normal and S2 normal. Heart sounds are distant. No murmur heard.    No friction rub. No gallop.  Pulmonary:     Effort: Pulmonary effort is normal. No respiratory distress.     Breath sounds: Normal breath sounds. No wheezing, rhonchi or rales.  Chest:     Chest wall: No tenderness.  Abdominal:     General: There is no distension.     Tenderness: There is no abdominal tenderness. There is no guarding or rebound.     Comments: Obesity unable assess HSM  Musculoskeletal:        General: Swelling (Trivial bilateral ankle) present.     Cervical back: Normal range of motion and neck supple.  Right lower leg: Edema (Right ankle is more puffy and swollen) present.  Skin:    General: Skin is warm and dry.     Coloration: Skin is not jaundiced or pale.  Neurological:     General: No focal deficit present.     Mental Status: She is alert and oriented to person, place, and time.     Gait: Gait abnormal.  Psychiatric:        Mood and Affect: Mood normal.        Behavior: Behavior normal.     Comments: I question her insight the nature of her disease now.  She has not made any effort to be active.     Adult ECG Report  Rate: 77 ;  Rhythm: normal sinus rhythm and TWI anterolateral leads - less prominent than prior  ;   Narrative Interpretation: likely progression of MI from 03/2022  Recent Labs:  reviewed.   Lab Results  Component Value Date   CHOL  181 03/28/2022   HDL 42 03/28/2022   LDLCALC 111 (H) 03/28/2022   TRIG 141 03/28/2022   CHOLHDL 4.3 03/28/2022  Lp(a) - 33.4   Lab Results  Component Value Date   CREATININE 1.54 (H) 03/29/2022   BUN 22 03/29/2022   NA 138 03/29/2022   K 3.9 03/29/2022   CL 112 (H) 03/29/2022   CO2 20 (L) 03/29/2022      Latest Ref Rng & Units 03/29/2022    2:07 AM 03/28/2022    7:51 PM 03/28/2022    5:06 AM  CBC  WBC 4.0 - 10.5 K/uL 6.4  7.3  7.5   Hemoglobin 12.0 - 15.0 g/dL 11.3  12.4  12.9   Hematocrit 36.0 - 46.0 % 35.2  36.2  41.3   Platelets 150 - 400 K/uL 134  136  151     Lab Results  Component Value Date   HGBA1C 5.9 (H) 03/27/2022   Lab Results  Component Value Date   TSH 3.565 03/27/2022    ================================================== I spent a total of 28 minutes with the patient spent in direct patient consultation.  Additional time spent with chart review  / charting (studies, outside notes, etc): 21 min Total Time: 49 min  Current medicines are reviewed at length with the patient today.  (+/- concerns) cost issues with Brilinta.  Notice: This dictation was prepared with Dragon dictation along with smart phrase technology. Any transcriptional errors that result from this process are unintentional and may not be corrected upon review.   Studies Ordered:  Orders Placed This Encounter  Procedures   Lipid Profile   Comp Met (CMET)   HgB A1c   EKG 12-Lead   Meds ordered this encounter  Medications   prasugrel (EFFIENT) 10 MG TABS tablet    Sig: Take 1 tablet (10 mg total) by mouth daily.    Dispense:  30 tablet    Refill:  11    Stopping brilinta   carvedilol (COREG) 6.25 MG tablet    Sig: Take 0.5 tablet (3.125 mg) by mouth in the morning and 1 tablet (6.25 mg) by mouth in the evening    Dispense:  60 tablet    Refill:  11    Patient Instructions / Medication Changes & Studies & Tests Ordered   Patient Instructions  Medication Instructions:  -  Your physician has recommended you make the following change in your medication:   1) DECREASE coreg (carvediolol) 6.25 mg: - take 0.5 tablet (3.125  mg) by mouth in the morning & take 1 tablet (6.25 mg) by mouth in the evening   2) *Once you have completed your current dose of Brilinta* START Effient 10 mg:  - take 1 tablet by mouth once daily   Do not miss any doses between your last dose of Brilinta and 1 st dose of Effient. When you take your last PM dose of Brilinta, then next morning you will start Effient.  *If you need a refill on your cardiac medications before your next appointment, please call your pharmacy*   Lab Work: - Your physician recommends that you return for lab work in: Late January (Dr. Allison Quarry nurse will contact you closer to that time to remind you.)  Lipid/ CMET/ HgbA1C  DO NOT eat/ drink anything for 8 hours prior to your lab draw, except for water or black coffee  Medical Mall Entrance at [facility] 1st desk on the right to check in (REGISTRATION)  Lab hours: Monday- Friday (7:30 am- 5:30 pm)   If you have labs (blood work) drawn today and your tests are completely normal, you will receive your results only by: MyChart Message (if you have MyChart) OR A paper copy in the mail If you have any lab test that is abnormal or we need to change your treatment, we will call you to review the results.   Testing/Procedures:  1) You have been referred to cardiac rehab. This is a combination program including monitored exercise, dietary education, and support group. We strongly recommend participating in the program. Expect a phone call from them in approximately 2 weeks. If it has been more than 2 weeks and you have not heard from [facility], please call them directly at [phone].     Follow-Up: At [facility], you and your health needs are our priority.  As part of our continuing mission to provide you with exceptional heart care, we have  created designated Provider Care Teams.  These Care Teams include your primary Cardiologist (physician) and Advanced Practice Providers (APPs -  Physician Assistants and Nurse Practitioners) who all work together to provide you with the care you need, when you need it.  We recommend signing up for the patient portal called "MyChart".  Sign up information is provided on this After Visit Summary.  MyChart is used to connect with patients for Virtual Visits (Telemedicine).  Patients are able to view lab/test results, encounter notes, upcoming appointments, etc.  Non-urgent messages can be sent to your provider as well.   To learn more about what you can do with MyChart, go to [email or web address].    Your next appointment:   Early February   The format for your next appointment:   In Person  Provider:   You may see Madison Hew, MD or one of the following Advanced Practice Providers on your designated Care Team:   Murray Hodgkins, NP Christell Faith, PA-C Cadence Kathlen Mody, PA-C Gerrie Nordmann, NP    Other Instructions N/a  Important Information About Sugar           Leonie Man, MD, MS Madison Calhoun, M.D., M.S. Interventional Cardiologist  [facility]   [address] [place], [place]  [address] [phone]           Fax [phone]    Thank you for choosing [facility] in [place]!!

## 2022-04-17 NOTE — Patient Instructions (Signed)
Medication Instructions:  - Your physician has recommended you make the following change in your medication:   1) DECREASE coreg (carvediolol) 6.25 mg: - take 0.5 tablet (3.125 mg) by mouth in the morning & take 1 tablet (6.25 mg) by mouth in the evening   2) *Once you have completed your current dose of Brilinta* START Effient 10 mg:  - take 1 tablet by mouth once daily   Do not miss any doses between your last dose of Brilinta and 1 st dose of Effient. When you take your last PM dose of Brilinta, then next morning you will start Effient.  *If you need a refill on your cardiac medications before your next appointment, please call your pharmacy*   Lab Work: - Your physician recommends that you return for lab work in: Late January (Dr. Allison Quarry nurse will contact you closer to that time to remind you.)  Lipid/ CMET/ HgbA1C  DO NOT eat/ drink anything for 8 hours prior to your lab draw, except for water or black coffee  Medical Mall Entrance at [facility] 1st desk on the right to check in (REGISTRATION)  Lab hours: Monday- Friday (7:30 am- 5:30 pm)   If you have labs (blood work) drawn today and your tests are completely normal, you will receive your results only by: MyChart Message (if you have MyChart) OR A paper copy in the mail If you have any lab test that is abnormal or we need to change your treatment, we will call you to review the results.   Testing/Procedures:  1) You have been referred to cardiac rehab. This is a combination program including monitored exercise, dietary education, and support group. We strongly recommend participating in the program. Expect a phone call from them in approximately 2 weeks. If it has been more than 2 weeks and you have not heard from [facility], please call them directly at [phone].     Follow-Up: At [facility], you and your health needs are our priority.  As part of our continuing mission to provide you with  exceptional heart care, we have created designated Provider Care Teams.  These Care Teams include your primary Cardiologist (physician) and Advanced Practice Providers (APPs -  Physician Assistants and Nurse Practitioners) who all work together to provide you with the care you need, when you need it.  We recommend signing up for the patient portal called "MyChart".  Sign up information is provided on this After Visit Summary.  MyChart is used to connect with patients for Virtual Visits (Telemedicine).  Patients are able to view lab/test results, encounter notes, upcoming appointments, etc.  Non-urgent messages can be sent to your provider as well.   To learn more about what you can do with MyChart, go to [email or web address].    Your next appointment:   Early February   The format for your next appointment:   In Person  Provider:   You may see Glenetta Hew, MD or one of the following Advanced Practice Providers on your designated Care Team:   Murray Hodgkins, NP Christell Faith, PA-C Cadence Kathlen Mody, PA-C Gerrie Nordmann, NP    Other Instructions N/a  Important Information About Sugar

## 2022-05-13 ENCOUNTER — Encounter: Payer: Self-pay | Admitting: Cardiology

## 2022-05-13 NOTE — Assessment & Plan Note (Signed)
EF is down little bit at 45%.  Unfortunate her blood pressure is little bit low, so we are not able to titrate GDMT much further. In fact, because of exercise intolerance, fatigue and some dizziness, back off her daytime dose of carvedilol to 1/2 tablet.  I do not think she be able to tolerate afterload reduction although will consider low-dose losartan follow-up visit.

## 2022-05-13 NOTE — Assessment & Plan Note (Signed)
For follow-up labs, we will check a full CMP as opposed to simply doing LFTs.  Need to monitor renal function.

## 2022-05-13 NOTE — Assessment & Plan Note (Signed)
Counseled on the importance of dietary modification and try to get more exercise.  Cardiac rehab referral.  Hopefully with some exercise and some education about diet, she may gain some ground.

## 2022-05-13 NOTE — Assessment & Plan Note (Signed)
A1c was 5.9.  Not at the level that would allow [place] to use Ozempic or Mounjaro for weight loss however could consider Wegovy in the future.  At present not requiring any medication, although could consider SGLT2 in the

## 2022-05-13 NOTE — Assessment & Plan Note (Signed)
Started on modest dose Lipitor 40 mg in the hospital.  LDL was 111 at that time.  She needs follow-up labs in roughly January timeframe.-Lipids, c-Met, A1c-we will also try to add LP(a)

## 2022-05-13 NOTE — Assessment & Plan Note (Signed)
Proximal LAD and LCx PCI in setting of non-STEMI.  We will plan DAPT for at least a year.  Thankfully, not having any angina.  She is just not active. Need to refer to cardiac rehab to increase level of exercise and adjust diet.  Plan: Continue carvedilol, but systems were more energy during the day we will reduce daytime dose to 3.25 mg and continue 625 mg at night. Continue atorvastatin Continue DAPT: Uninterrupted. Financial issues with Brilinta, will convert to Effient to complete 1 year DAPT For urgent procedures in the first 6 months, would need bridging with IV antiplatelet agent preoperatively After 6 months, (late April 2024) would be okay to hold for urgent-Brilinta is held 9 days preop 6 months out-late April 2024, if bleeding or bruising is significant, can stop aspirin 1 year out (end of October 2024)-would DC aspirin and switch to Plavix 75 mg monotherapy to complete second year.

## 2022-07-03 ENCOUNTER — Encounter: Payer: Self-pay | Admitting: Nurse Practitioner

## 2022-07-03 ENCOUNTER — Ambulatory Visit: Payer: Medicare Other | Admitting: Nurse Practitioner

## 2022-07-03 DIAGNOSIS — N1832 Chronic kidney disease, stage 3b: Secondary | ICD-10-CM

## 2022-07-03 DIAGNOSIS — I255 Ischemic cardiomyopathy: Secondary | ICD-10-CM | POA: Diagnosis not present

## 2022-07-03 DIAGNOSIS — I252 Old myocardial infarction: Secondary | ICD-10-CM | POA: Diagnosis not present

## 2022-07-03 DIAGNOSIS — E785 Hyperlipidemia, unspecified: Secondary | ICD-10-CM

## 2022-07-03 DIAGNOSIS — D500 Iron deficiency anemia secondary to blood loss (chronic): Secondary | ICD-10-CM

## 2022-07-03 DIAGNOSIS — R7303 Prediabetes: Secondary | ICD-10-CM

## 2022-07-03 DIAGNOSIS — E039 Hypothyroidism, unspecified: Secondary | ICD-10-CM | POA: Diagnosis not present

## 2022-07-03 DIAGNOSIS — I2510 Atherosclerotic heart disease of native coronary artery without angina pectoris: Secondary | ICD-10-CM | POA: Diagnosis not present

## 2022-07-03 DIAGNOSIS — R600 Localized edema: Secondary | ICD-10-CM

## 2022-07-03 DIAGNOSIS — Z9861 Coronary angioplasty status: Secondary | ICD-10-CM

## 2022-07-03 LAB — LIPID PANEL

## 2022-07-03 MED ORDER — PRASUGREL HCL 10 MG PO TABS: 10.0000 mg | ORAL_TABLET | Freq: Every day | ORAL | 3 refills | Status: DC

## 2022-07-03 NOTE — Progress Notes (Signed)
Orma Render, DNP, AGNP-c [facility] [address] [place],  [address] Main Office [phone]   New patient visit   Patient: Madison Calhoun   DOB: 07-04-1950   72 y.o. Female  MRN: [id number] Visit Date: 07/03/2022  Patient Care Team: Torrian Canion, Coralee Pesa, NP as PCP - General (Nurse Practitioner) Leonie Man, MD as PCP - Cardiology (Cardiology)  Today's Vitals   07/03/22 1506  BP: 110/82  Pulse: 99  SpO2: 95%  Weight: 229 lb 3.2 oz (104 kg)  Height: '5\' 2"'$  (1.575 m)   Body mass index is 41.92 kg/m.   Today's healthcare provider: Orma Render, NP   Chief Complaint  Patient presents with   New Patient (Initial Visit)    New patient est. Problems after heart attack. Heart attack in October, feelings of fainting or passing out. Martin Majestic on before and right after heart attack) went to [facility] in [place].    Subjective    Madison Calhoun is a 72 y.o. female who presents today as a new patient to establish care.    Patient endorses the following concerns presently: MI She tells me since her heart attack she feels like she is going to pass out when she is standing in one place for longer than a couple of minutes, like when she is cooking or washing dishes. She tells me she has noticed bleeding when her dogs scratch her on her arms, no other signs of bleeding.  She tells me she takes 2-3 naps a day because she is up all night urinating.  She tells me that her feet don't swell during the day, but she does endorse pain in the feet, but Tylenol helps this.  She tells me she will not do cardiac rehab if she has to go three days a week Hypothyroid Not taking any medication for this. Denies symptoms  History reviewed and reveals the following: Past Medical History:  Diagnosis Date   Arthritis    CAD S/P percutaneous coronary intervention-the PCI to LAD and LCx 03/29/2022   S/p NSTEMI 03/2022 >> s/p 2.75 x 16 mm DES to pLAD and 3.5 x 12 mm  DES to pLCx   Chicken pox    CKD (chronic kidney disease) stage 3, GFR 30-59 ml/min (HCC) 03/29/2022   Glaucoma    Hyperlipidemia LDL goal <70 03/29/2022   Ischemic cardiomyopathy 03/29/2022   S/p NSTEMI in 03/2022 // Echocardiogram 03/2022: EF 45, dist ant, dis inf and apical HK/AK (diff acoustic windows), mild LVH, normal RVSF, trivial MR, RAP 3   Non-STEMI (non-ST elevated myocardial infarction) ([place]) 03/27/2022   Thyroid disease    Past Surgical History:  Procedure Laterality Date   BUNIONECTOMY     CATARACT EXTRACTION W/ INTRAOCULAR LENS IMPLANT Bilateral    CORONARY STENT INTERVENTION N/A 03/28/2022   Procedure: CORONARY STENT INTERVENTION;  Surgeon: Sherren Mocha, MD;  Location: [facility];  Service: CV: Proximal-mid LAD 90% (DES PCI-Synergy XD 2.75 x 16 -> 3.0 mm postdilation); proximal LCx 90% (DES PCI-Synergy XD 3.5 x 12 -> 4.0 mm). => Both lesions reduced to 0% with TIMI-3 flow preserved.   GALLBLADDER SURGERY  06/09/1988   GLAUCOMA SURGERY     LEFT HEART CATH AND CORONARY ANGIOGRAPHY N/A 03/28/2022   Procedure: LEFT HEART CATH AND CORONARY ANGIOGRAPHY;  Surgeon: Sherren Mocha, MD;  Location: [facility];  Service: CV: Non-STEMI: Ostial LAD 50%, proximal-mid LAD 90% (DES PCI); proximal LCx 90% (DES PCI).  Mild nonobstructive disease in the RCA.  Normal LV function-normal LVEDP.   SPINE SURGERY     TRANSTHORACIC ECHOCARDIOGRAM  03/28/2022   (Non-STEMI) EF estimated 45% with distal anterior, inferior and apical hypo to akinesis.  Normal valves.  Normal RV.  Normal RAP.   Family Status  Relation Name Status   Mother  Deceased   Father  Deceased   Brother  Deceased   Brother  Alive   Brother  Alive   Family History  Problem Relation Age of Onset   Alcohol abuse Mother    Cancer Mother        Uterine   Heart disease Mother    Mental illness Mother        Alzheimers   Hyperlipidemia Mother    Hypertension Mother    Kidney disease Mother    Alcohol  abuse Father    Heart disease Father    Heart disease Brother    Arthritis Brother    Hyperlipidemia Brother    Hypertension Brother    Kidney disease Brother    Heart disease Brother    Hyperlipidemia Brother    Hypertension Brother    Social History   Socioeconomic History   Marital status: Legally Separated    Spouse name: Not on file   Number of children: Not on file   Years of education: Not on file   Highest education level: Not on file  Occupational History   Not on file  Tobacco Use   Smoking status: Former    Types: Cigarettes    Quit date: 04/02/1976    Years since quitting: 46.3   Smokeless tobacco: Not on file  Vaping Use   Vaping Use: Never used  Substance and Sexual Activity   Alcohol use: No    Alcohol/week: 0.0 standard drinks of alcohol   Drug use: No   Sexual activity: Not on file  Other Topics Concern   Not on file  Social History Narrative   Single.   Twin children.   Retired. Worked as a Dealer.   Enjoys playing computer games, plays with her dogs.      Her brother was Sonia Side -> long-term patient of Dr. Ellyn Hack who passed away Christmas a couple years ago.   Social Determinants of Health   Financial Resource Strain: Not on file  Food Insecurity: Not on file  Transportation Needs: Not on file  Physical Activity: Not on file  Stress: Not on file  Social Connections: Not on file   Outpatient Medications Prior to Visit  Medication Sig   aspirin EC 81 MG tablet Take 1 tablet (81 mg total) by mouth daily. Swallow whole.   atorvastatin (LIPITOR) 40 MG tablet Take 1 tablet (40 mg total) by mouth every evening.   carvedilol (COREG) 6.25 MG tablet Take 0.5 tablet (3.125 mg) by mouth in the morning and 1 tablet (6.25 mg) by mouth in the evening   nitroGLYCERIN (NITROSTAT) 0.4 MG SL tablet Place 1 tablet (0.4 mg total) under the tongue every 5 (five) minutes x 3 doses as needed for chest pain.   [DISCONTINUED] prasugrel (EFFIENT) 10  MG TABS tablet Take 1 tablet (10 mg total) by mouth daily.   No facility-administered medications prior to visit.   No Known Allergies Immunization History  Administered Date(s) Administered   Tdap 10/30/2014    Health Maintenance Due Health Maintenance Topics with due status: Overdue     Topic Date Due   Medicare Annual Wellness (AWV)  Never done   COVID-19 Vaccine Never done   Hepatitis C Screening Never done   COLONOSCOPY (Pts 45-74yr Insurance coverage will need to be confirmed) Never done   MAMMOGRAM Never done   Zoster Vaccines- Shingrix Never done   Pneumonia Vaccine 72 Years old Never done   DEXA SCAN Never done    Review of Systems All review of systems negative except what is listed in the HPI   Objective    BP 110/82   Pulse 99   Ht '5\' 2"'$  (1.575 m)   Wt 229 lb 3.2 oz (104 kg)   SpO2 95%   BMI 41.92 kg/m  Physical Exam Vitals and nursing note reviewed.  Constitutional:      General: She is not in acute distress.    Appearance: Normal appearance.  HENT:     Head: Normocephalic and atraumatic.  Eyes:     Extraocular Movements: Extraocular movements intact.     Conjunctiva/sclera: Conjunctivae normal.     Pupils: Pupils are equal, round, and reactive to light.  Neck:     Vascular: No carotid bruit.  Cardiovascular:     Rate and Rhythm: Normal rate and regular rhythm.     Pulses: Normal pulses.     Heart sounds: Normal heart sounds. No murmur heard. Pulmonary:     Effort: Pulmonary effort is normal.     Breath sounds: Normal breath sounds. No wheezing.  Abdominal:     General: Bowel sounds are normal.     Palpations: Abdomen is soft.  Musculoskeletal:        General: Normal range of motion.     Cervical back: Normal range of motion.     Right lower leg: Edema present.     Left lower leg: Edema present.  Skin:    General: Skin is warm and dry.     Capillary Refill: Capillary refill takes less than 2 seconds.  Neurological:     General: No  focal deficit present.     Mental Status: She is alert and oriented to person, place, and time.  Psychiatric:        Mood and Affect: Mood normal.        Behavior: Behavior normal.        Thought Content: Thought content normal.        Judgment: Judgment normal.     Results for orders placed or performed in visit on 07/03/22  CBC with Differential/Platelet  Result Value Ref Range   WBC 6.6 3.4 - 10.8 x10E3/uL   RBC 4.61 3.77 - 5.28 x10E6/uL   Hemoglobin 13.3 11.1 - 15.9 g/dL   Hematocrit 41.6 34.0 - 46.6 %   MCV 90 79 - 97 fL   MCH 28.9 26.6 - 33.0 pg   MCHC 32.0 31.5 - 35.7 g/dL   RDW 12.5 11.7 - 15.4 %   Platelets 184 150 - 450 x10E3/uL   Neutrophils 73 Not Estab. %   Lymphs 16 Not Estab. %   Monocytes 7 Not Estab. %   Eos 3 Not Estab. %   Basos 1 Not Estab. %   Neutrophils Absolute 4.8 1.4 - 7.0 x10E3/uL   Lymphocytes Absolute 1.0 0.7 - 3.1 x10E3/uL   Monocytes Absolute 0.5 0.1 - 0.9 x10E3/uL   EOS (ABSOLUTE) 0.2 0.0 - 0.4 x10E3/uL   Basophils Absolute 0.1 0.0 - 0.2 x10E3/uL   Immature Granulocytes 0 Not Estab. %   Immature Grans (Abs) 0.0 0.0 - 0.1 x10E3/uL  Comprehensive metabolic panel  Result Value Ref Range   Glucose 102 (H) 70 - 99 mg/dL   BUN 21 8 - 27 mg/dL   Creatinine, Ser 1.36 (H) 0.57 - 1.00 mg/dL   eGFR 41 (L) >59 mL/min/1.73   BUN/Creatinine Ratio 15 12 - 28   Sodium 141 134 - 144 mmol/L   Potassium 4.5 3.5 - 5.2 mmol/L   Chloride 103 96 - 106 mmol/L   CO2 21 20 - 29 mmol/L   Calcium 9.7 8.7 - 10.3 mg/dL   Total Protein 7.2 6.0 - 8.5 g/dL   Albumin 3.9 3.8 - 4.8 g/dL   Globulin, Total 3.3 1.5 - 4.5 g/dL   Albumin/Globulin Ratio 1.2 1.2 - 2.2   Bilirubin Total 0.7 0.0 - 1.2 mg/dL   Alkaline Phosphatase 247 (H) 44 - 121 IU/L   AST 27 0 - 40 IU/L   ALT 30 0 - 32 IU/L  Hemoglobin A1c  Result Value Ref Range   Hgb A1c MFr Bld 6.3 (H) 4.8 - 5.6 %   Est. average glucose Bld gHb Est-mCnc 134 mg/dL  Lipid panel  Result Value Ref Range   Cholesterol,  Total 146 100 - 199 mg/dL   Triglycerides 126 0 - 149 mg/dL   HDL 43 >39 mg/dL   VLDL Cholesterol Cal 23 5 - 40 mg/dL   LDL Chol Calc (NIH) 80 0 - 99 mg/dL   Chol/HDL Ratio 3.4 0.0 - 4.4 ratio  VITAMIN D 25 Hydroxy (Vit-D Deficiency, Fractures)  Result Value Ref Range   Vit D, 25-Hydroxy 10.7 (L) 30.0 - 100.0 ng/mL  Iron, TIBC and Ferritin Panel  Result Value Ref Range   Total Iron Binding Capacity 289 250 - 450 ug/dL   UIBC 181 118 - 369 ug/dL   Iron 108 27 - 139 ug/dL   Iron Saturation 37 15 - 55 %   Ferritin 437 (H) 15 - 150 ng/mL  TSH  Result Value Ref Range   TSH 4.660 (H) 0.450 - 4.500 uIU/mL  T4, free  Result Value Ref Range   Free T4 1.27 0.82 - 1.77 ng/dL    Assessment & Plan      Problem List Items Addressed This Visit     Morbid obesity ([place]) - Primary (Chronic)    Chronic. In the setting of CKD, PreDM, HF, HLD, and post MI her RF are very high for new or repeat events if she is unable to get the weight down.  Recommend Start cardiac rehab- even if just one day a week Monitor diet closely, keep saturated fat and carbohydrates to a minimum.  Your diet should be high in brightly colored vegetables, lean meats, and fiber.  Start walking at least 10 minutes every day.       Relevant Orders   CBC with Differential/Platelet (Completed)   Comprehensive metabolic panel (Completed)   Hemoglobin A1c (Completed)   Lipid panel (Completed)   VITAMIN D 25 Hydroxy (Vit-D Deficiency, Fractures) (Completed)   Iron, TIBC and Ferritin Panel (Completed)   TSH (Completed)   T4, free (Completed)   Pre-diabetes (Chronic)    Chronic. Her A1c is not high enough to get coverage for GLP-1 for blood sugar control, but we may be able to get coverage based on her cardiac conditions and comorbidities as she is very high risk of repeat occurrence of cardiovascular complications. We may be able to get approval of GLP for weight. Will monitor today.  Recommendations Diet and exercise  recommendations listed above.  Relevant Orders   CBC with Differential/Platelet (Completed)   Comprehensive metabolic panel (Completed)   Hemoglobin A1c (Completed)   Lipid panel (Completed)   VITAMIN D 25 Hydroxy (Vit-D Deficiency, Fractures) (Completed)   Iron, TIBC and Ferritin Panel (Completed)   TSH (Completed)   T4, free (Completed)   CAD S/P 2 Vessel DES PCI (prox-mid LAD & prox LCx) (Chronic)    SP stenting. No CP present, but she is experiencing pre-syncopal symptoms when standing in one place. Unclear if this is related to remodeling of the vascular and neurohormonal system or other cause. She has not had any LOC or falls.  Recommend: Avoid standing in single location for extended periods. Avoid rapid movements when standing. Allow a few seconds for blood pressure to normalize after standing in single location or when getting up  Start cardiac rehab to improve stamina and allow for observation of symptoms when standing and movement. Wear compression stockings Follow-up with cardiology for further evaluation and recommendations.       Relevant Medications   prasugrel (EFFIENT) 10 MG TABS tablet   Other Relevant Orders   CBC with Differential/Platelet (Completed)   Comprehensive metabolic panel (Completed)   Hemoglobin A1c (Completed)   Lipid panel (Completed)   VITAMIN D 25 Hydroxy (Vit-D Deficiency, Fractures) (Completed)   Iron, TIBC and Ferritin Panel (Completed)   TSH (Completed)   T4, free (Completed)   Ischemic cardiomyopathy (Chronic)    Recent episodes of dizziness while standing for extended periods. Review of most recent ECHO shows decreased EF, which is likely a contributor as well as lower blood pressures. Recent cardiology visit resulted in lowered dose of BB, unclear if this has made a difference. I do feel that she would benefit from cardiac rehab to help improve stamina. Recommendations Start cardiac rehab- cardiology may be able to recommend fewer  days of the week or locations closer to home. In home rehab may be an option. Avoid standing in one location for long periods.  Wear compression stockings for help with reducing work on heart       Relevant Medications   prasugrel (EFFIENT) 10 MG TABS tablet   Other Relevant Orders   CBC with Differential/Platelet (Completed)   Comprehensive metabolic panel (Completed)   Hemoglobin A1c (Completed)   Lipid panel (Completed)   VITAMIN D 25 Hydroxy (Vit-D Deficiency, Fractures) (Completed)   Iron, TIBC and Ferritin Panel (Completed)   TSH (Completed)   T4, free (Completed)   Hyperlipidemia LDL goal <70 (Chronic)    Chronic. Started on atrovastatin in October. We will recheck her lipids today. Her goal is <70 due to her chronic health concerns and recent MI.  Recommendations Continue with low fat, low carb, high fiber diet Daily saturated fat should be 13g or less, carbohydrates should be 150-180 grams, protein should be 30-40 grams, and fiber should be at least 30 grams.  Work on exercise, even if this is just walking a few minutes a day to start out.       Relevant Medications   prasugrel (EFFIENT) 10 MG TABS tablet   Other Relevant Orders   CBC with Differential/Platelet (Completed)   Comprehensive metabolic panel (Completed)   Hemoglobin A1c (Completed)   Lipid panel (Completed)   VITAMIN D 25 Hydroxy (Vit-D Deficiency, Fractures) (Completed)   Iron, TIBC and Ferritin Panel (Completed)   TSH (Completed)   T4, free (Completed)   CKD (chronic kidney disease) stage 3, GFR 30-59 ml/min (HCC) (Chronic)    Labs pending  today. She is having increased urination at night. I suspect this is related to poor filtration during the day related to decreased perfusion. She denies edema in LE. At this time recommend close monitoring of kidney function and frequent review of medication to ensure that nephrotoxic medications are not being taken.  Recommend: Wear compression stockings and keep  feet elevated when sitting throughout the day.  Avoid medications that are hard on the kidneys, such as ibuprofen, aleve, etc.       Relevant Medications   prasugrel (EFFIENT) 10 MG TABS tablet   Other Relevant Orders   CBC with Differential/Platelet (Completed)   Comprehensive metabolic panel (Completed)   Hemoglobin A1c (Completed)   Lipid panel (Completed)   VITAMIN D 25 Hydroxy (Vit-D Deficiency, Fractures) (Completed)   Iron, TIBC and Ferritin Panel (Completed)   TSH (Completed)   T4, free (Completed)   Hypothyroidism    Diagnosis many years ago- chart review suggests in the 1990's. She has not been on medication in many years. She denies any symptoms at this time. She does endorse fatigue and dizziness, however, unclear if this is more related to her recent cardiac issues or thyroid. We will monitor TSH today.  Recommendations We will see what your thyroid levels show and determine if medication may be helpful for you.  My nurse will contact you with my recommendations once I have had a chance to review the results.       Relevant Orders   CBC with Differential/Platelet (Completed)   Comprehensive metabolic panel (Completed)   Hemoglobin A1c (Completed)   Lipid panel (Completed)   VITAMIN D 25 Hydroxy (Vit-D Deficiency, Fractures) (Completed)   Iron, TIBC and Ferritin Panel (Completed)   TSH (Completed)   T4, free (Completed)   Lower extremity edema    LE edema in the setting of decreased EF and CKD. Suspect that edema is the cause of her nocturia. Recommendations Keep feet elevated when sitting Wear compression stocking Avoid increased salt in diet       Relevant Orders   CBC with Differential/Platelet (Completed)   Comprehensive metabolic panel (Completed)   Hemoglobin A1c (Completed)   Lipid panel (Completed)   VITAMIN D 25 Hydroxy (Vit-D Deficiency, Fractures) (Completed)   Iron, TIBC and Ferritin Panel (Completed)   TSH (Completed)   T4, free (Completed)    History of non-ST elevation myocardial infarction (NSTEMI)    NSTEMI in October of last year. Followed closely with cardiology. She has chosen not to participate in cardiac rehab thus far due to concerns of distance to rehab locations. I do feel this would be very beneficial for her as she is having symptoms of dizziness and intolerance to standing for minimal amounts of time. No alarm symptoms are present. BP is slightly low, but no signs of hypotension.  Recommendations: Follow closely with cardiology Take medications as prescribed.  Strongly consider cardiac rehab, even if just one day a week to help build strength and stamina       Iron deficiency anemia due to chronic blood loss    Chronic history of anemia. Will monitor labs today. No signs of bleeding at this time. Given her episodes of dizziness/pre-syncope while standing, it is possible that her iron levels are too low.  Recommendations My nurse will contact you with your labs once I have had a chance to evaluate these.        Time: 70 minutes, >50% spent counseling, care coordination, chart review, and documentation.  Return in about 8 weeks (around 08/28/2022) for med mgmt.      Garrett Mitchum, Coralee Pesa, NP, DNP, AGNP-C [facility]

## 2022-07-03 NOTE — Patient Instructions (Addendum)
You are in chronic kidney disease stage 3b.  This is very important that you drink plenty of water - 3.5 water bottles worth at least every day.   No more than 8 ounces of Coke, Juice, Soda, or sweet tea a day. This will help your blood sugars.   Walk in your back yard, working your way up to 10 minutes every day.   I will let you know what your labs show and we will mail you a copy to send to your daughter.   If the prasugrel is more than $71 for all three months, I will be happy to change the order to go to the mail order pharmacy. Just let me know.

## 2022-07-04 LAB — LIPID PANEL
Chol/HDL Ratio: 3.4 ratio (ref 0.0–4.4)
Cholesterol, Total: 146 mg/dL (ref 100–199)
HDL: 43 mg/dL (ref >39)
LDL Chol Calc (NIH): 80 mg/dL (ref 0–99)
Triglycerides: 126 mg/dL (ref 0–149)
VLDL Cholesterol Cal: 23 mg/dL (ref 5–40)

## 2022-07-04 LAB — CBC WITH DIFFERENTIAL/PLATELET
Basophils Absolute: 0.1 10*3/uL (ref 0.0–0.2)
Basos: 1 %
EOS (ABSOLUTE): 0.2 10*3/uL (ref 0.0–0.4)
Eos: 3 %
Hematocrit: 41.6 % (ref 34.0–46.6)
Hemoglobin: 13.3 g/dL (ref 11.1–15.9)
Immature Grans (Abs): 0 10*3/uL (ref 0.0–0.1)
Immature Granulocytes: 0 %
Lymphocytes Absolute: 1 10*3/uL (ref 0.7–3.1)
Lymphs: 16 %
MCH: 28.9 pg (ref 26.6–33.0)
MCHC: 32 g/dL (ref 31.5–35.7)
MCV: 90 fL (ref 79–97)
Monocytes Absolute: 0.5 10*3/uL (ref 0.1–0.9)
Monocytes: 7 %
Neutrophils Absolute: 4.8 10*3/uL (ref 1.4–7.0)
Neutrophils: 73 %
Platelets: 184 10*3/uL (ref 150–450)
RBC: 4.61 x10E6/uL (ref 3.77–5.28)
RDW: 12.5 % (ref 11.7–15.4)
WBC: 6.6 10*3/uL (ref 3.4–10.8)

## 2022-07-04 LAB — IRON,TIBC AND FERRITIN PANEL
Ferritin: 437 ng/mL — ABNORMAL HIGH (ref 15–150)
Iron Saturation: 37 % (ref 15–55)
Iron: 108 ug/dL (ref 27–139)
Total Iron Binding Capacity: 289 ug/dL (ref 250–450)
UIBC: 181 ug/dL (ref 118–369)

## 2022-07-04 LAB — COMPREHENSIVE METABOLIC PANEL
ALT: 30 IU/L (ref 0–32)
AST: 27 IU/L (ref 0–40)
Albumin/Globulin Ratio: 1.2 (ref 1.2–2.2)
Albumin: 3.9 g/dL (ref 3.8–4.8)
Alkaline Phosphatase: 247 IU/L — ABNORMAL HIGH (ref 44–121)
BUN/Creatinine Ratio: 15 (ref 12–28)
BUN: 21 mg/dL (ref 8–27)
Bilirubin Total: 0.7 mg/dL (ref 0.0–1.2)
CO2: 21 mmol/L (ref 20–29)
Calcium: 9.7 mg/dL (ref 8.7–10.3)
Chloride: 103 mmol/L (ref 96–106)
Creatinine, Ser: 1.36 mg/dL — ABNORMAL HIGH (ref 0.57–1.00)
Globulin, Total: 3.3 g/dL (ref 1.5–4.5)
Glucose: 102 mg/dL — ABNORMAL HIGH (ref 70–99)
Potassium: 4.5 mmol/L (ref 3.5–5.2)
Sodium: 141 mmol/L (ref 134–144)
Total Protein: 7.2 g/dL (ref 6.0–8.5)
eGFR: 41 mL/min/{1.73_m2} — ABNORMAL LOW (ref >59)

## 2022-07-04 LAB — T4, FREE: Free T4: 1.27 ng/dL (ref 0.82–1.77)

## 2022-07-04 LAB — VITAMIN D 25 HYDROXY (VIT D DEFICIENCY, FRACTURES): Vit D, 25-Hydroxy: 10.7 ng/mL — ABNORMAL LOW (ref 30.0–100.0)

## 2022-07-04 LAB — TSH: TSH: 4.66 u[IU]/mL — ABNORMAL HIGH (ref 0.450–4.500)

## 2022-07-04 LAB — HEMOGLOBIN A1C
Est. average glucose Bld gHb Est-mCnc: 134 mg/dL
Hgb A1c MFr Bld: 6.3 % — ABNORMAL HIGH (ref 4.8–5.6)

## 2022-07-13 NOTE — Assessment & Plan Note (Signed)
Chronic. Her A1c is not high enough to get coverage for GLP-1 for blood sugar control, but we may be able to get coverage based on her cardiac conditions and comorbidities as she is very high risk of repeat occurrence of cardiovascular complications. We may be able to get approval of GLP for weight. Will monitor today.  Recommendations Diet and exercise recommendations listed above.

## 2022-07-13 NOTE — Assessment & Plan Note (Signed)
Diagnosis many years ago- chart review suggests in the 1990's. She has not been on medication in many years. She denies any symptoms at this time. She does endorse fatigue and dizziness, however, unclear if this is more related to her recent cardiac issues or thyroid. We will monitor TSH today.  Recommendations We will see what your thyroid levels show and determine if medication may be helpful for you.  My nurse will contact you with my recommendations once I have had a chance to review the results.

## 2022-07-13 NOTE — Assessment & Plan Note (Signed)
NSTEMI in October of last year. Followed closely with cardiology. She has chosen not to participate in cardiac rehab thus far due to concerns of distance to rehab locations. I do feel this would be very beneficial for her as she is having symptoms of dizziness and intolerance to standing for minimal amounts of time. No alarm symptoms are present. BP is slightly low, but no signs of hypotension.  Recommendations: Follow closely with cardiology Take medications as prescribed.  Strongly consider cardiac rehab, even if just one day a week to help build strength and stamina

## 2022-07-13 NOTE — Assessment & Plan Note (Signed)
Chronic. In the setting of CKD, PreDM, HF, HLD, and post MI her RF are very high for new or repeat events if she is unable to get the weight down.  Recommend Start cardiac rehab- even if just one day a week Monitor diet closely, keep saturated fat and carbohydrates to a minimum.  Your diet should be high in brightly colored vegetables, lean meats, and fiber.  Start walking at least 10 minutes every day.

## 2022-07-13 NOTE — Assessment & Plan Note (Signed)
Recent episodes of dizziness while standing for extended periods. Review of most recent ECHO shows decreased EF, which is likely a contributor as well as lower blood pressures. Recent cardiology visit resulted in lowered dose of BB, unclear if this has made a difference. I do feel that she would benefit from cardiac rehab to help improve stamina. Recommendations Start cardiac rehab- cardiology may be able to recommend fewer days of the week or locations closer to home. In home rehab may be an option. Avoid standing in one location for long periods.  Wear compression stockings for help with reducing work on heart

## 2022-07-13 NOTE — Assessment & Plan Note (Signed)
LE edema in the setting of decreased EF and CKD. Suspect that edema is the cause of her nocturia. Recommendations Keep feet elevated when sitting Wear compression stocking Avoid increased salt in diet

## 2022-07-13 NOTE — Assessment & Plan Note (Signed)
Labs pending today. She is having increased urination at night. I suspect this is related to poor filtration during the day related to decreased perfusion. She denies edema in LE. At this time recommend close monitoring of kidney function and frequent review of medication to ensure that nephrotoxic medications are not being taken.  Recommend: Wear compression stockings and keep feet elevated when sitting throughout the day.  Avoid medications that are hard on the kidneys, such as ibuprofen, aleve, etc.

## 2022-07-13 NOTE — Assessment & Plan Note (Signed)
SP stenting. No CP present, but she is experiencing pre-syncopal symptoms when standing in one place. Unclear if this is related to remodeling of the vascular and neurohormonal system or other cause. She has not had any LOC or falls.  Recommend: Avoid standing in single location for extended periods. Avoid rapid movements when standing. Allow a few seconds for blood pressure to normalize after standing in single location or when getting up  Start cardiac rehab to improve stamina and allow for observation of symptoms when standing and movement. Wear compression stockings Follow-up with cardiology for further evaluation and recommendations.

## 2022-07-13 NOTE — Assessment & Plan Note (Signed)
Chronic. Started on atrovastatin in October. We will recheck her lipids today. Her goal is <70 due to her chronic health concerns and recent MI.  Recommendations Continue with low fat, low carb, high fiber diet Daily saturated fat should be 13g or less, carbohydrates should be 150-180 grams, protein should be 30-40 grams, and fiber should be at least 30 grams.  Work on exercise, even if this is just walking a few minutes a day to start out.

## 2022-07-13 NOTE — Assessment & Plan Note (Signed)
Chronic history of anemia. Will monitor labs today. No signs of bleeding at this time. Given her episodes of dizziness/pre-syncope while standing, it is possible that her iron levels are too low.  Recommendations My nurse will contact you with your labs once I have had a chance to evaluate these.

## 2022-07-14 ENCOUNTER — Encounter: Payer: Self-pay | Admitting: *Deleted

## 2022-07-17 ENCOUNTER — Ambulatory Visit: Payer: Medicare Other | Admitting: Cardiology

## 2022-07-17 ENCOUNTER — Ambulatory Visit: Payer: Medicare Other | Attending: Cardiology | Admitting: Cardiology

## 2022-07-17 ENCOUNTER — Encounter: Payer: Self-pay | Admitting: Cardiology

## 2022-07-17 VITALS — BP 140/80 | HR 86 | Ht 62.0 in | Wt 230.5 lb

## 2022-07-17 DIAGNOSIS — I252 Old myocardial infarction: Secondary | ICD-10-CM | POA: Diagnosis not present

## 2022-07-17 DIAGNOSIS — I2510 Atherosclerotic heart disease of native coronary artery without angina pectoris: Secondary | ICD-10-CM | POA: Diagnosis not present

## 2022-07-17 DIAGNOSIS — I255 Ischemic cardiomyopathy: Secondary | ICD-10-CM

## 2022-07-17 DIAGNOSIS — Z9861 Coronary angioplasty status: Secondary | ICD-10-CM | POA: Diagnosis not present

## 2022-07-17 DIAGNOSIS — N1832 Chronic kidney disease, stage 3b: Secondary | ICD-10-CM | POA: Insufficient documentation

## 2022-07-17 DIAGNOSIS — E785 Hyperlipidemia, unspecified: Secondary | ICD-10-CM | POA: Diagnosis not present

## 2022-07-17 MED ORDER — PRASUGREL HCL 10 MG PO TABS: 10.0000 mg | ORAL_TABLET | Freq: Every day | ORAL | 1 refills | Status: DC

## 2022-07-17 MED ORDER — EZETIMIBE 10 MG PO TABS: 10.0000 mg | ORAL_TABLET | Freq: Every day | ORAL | 1 refills | Status: DC

## 2022-07-17 NOTE — Assessment & Plan Note (Signed)
Restart on statin back, MI.  LDL did come down to 80 - not at goal.  Will add Zetia 10 mg daily to the Lipitor.  Recheck labs in roughly 2 to 3 months.   Low threshold to consider PCSK9 inhibitor.  Will also question if she may benefit from SGLT2 inhibitor if A1c were to increase and would convert to treating diabetes.

## 2022-07-17 NOTE — Assessment & Plan Note (Signed)
Follow-up echo showed EF up to 45% back in October.  No active heart failure symptoms.  She is not able to tolerate much in the way of carvedilol.  Would like to consider afterload reduction if pressures tolerate.  Neck step would be to add low-dose ARB.  After ARB would consider spironolactone.

## 2022-07-17 NOTE — Patient Instructions (Addendum)
Medication Instructions:  Your physician has recommended you make the following change in your medication:   START - ezetimibe (ZETIA) 10 MG tablet - Take 1 tablet (10 mg total) by mouth daily  *If you need a refill on your cardiac medications before your next appointment, please call your pharmacy*   Lab Work: Your physician recommends that you return for lab work in: 3 months  [place] [facility] 1st desk on the right to check in (REGISTRATION)  Lab hours: Monday- Friday (7:30 am- 5:30 pm)   If you have labs (blood work) drawn today and your tests are completely normal, you will receive your results only by: MyChart Message (if you have MyChart) OR A paper copy in the mail If you have any lab test that is abnormal or we need to change your treatment, we will call you to review the results.   Testing/Procedures: -None ordered   Follow-Up: At [facility], you and your health needs are our priority.  As part of our continuing mission to provide you with exceptional heart care, we have created designated Provider Care Teams.  These Care Teams include your primary Cardiologist (physician) and Advanced Practice Providers (APPs -  Physician Assistants and Nurse Practitioners) who all work together to provide you with the care you need, when you need it.  We recommend signing up for the patient portal called "MyChart".  Sign up information is provided on this After Visit Summary.  MyChart is used to connect with patients for Virtual Visits (Telemedicine).  Patients are able to view lab/test results, encounter notes, upcoming appointments, etc.  Non-urgent messages can be sent to your provider as well.   To learn more about what you can do with MyChart, go to [email or web address].    Your next appointment:   6 month(s)  Provider:   Glenetta Hew, MD    Other Instructions -None

## 2022-07-17 NOTE — Progress Notes (Signed)
Primary Care Provider: Early, Coralee Pesa, NP [place] Cardiologist: Glenetta Hew, MD Electrophysiologist: None  Referring Provider: Orma Render, NP  Clinic Note: Chief Complaint  Patient presents with   3 month follow up     Patient c/o tiredness and shortness of breath with over exertion. Medications reviewed by the patient verbally.    ===================================  ASSESSMENT/PLAN   Problem List Items Addressed This Visit       Cardiology Problems   CAD S/P 2 Vessel DES PCI (prox-mid LAD & prox LCx) - Primary (Chronic)    3 months out from LAD/LCx PCI.  She is not having any active anginal symptoms.  The exertional dyspnea is not a real change for her. I would be hesitant to think that her near syncope type symptoms are related to CAD because she was pretty much otherwise asymptomatic.  My suspicion is simply a change in blood pressure or volume status.  She is not really noticing any more chest pain or pressure just some deconditioning related exercise tolerance.  Plan: As she seems to be stabilizing we will continue current dose of carvedilol but low threshold to reduce the nighttime dose to half tablet as well. She says her blood pressures at home are better than they are here.  As such we will hold off on titrating further or adding new medication.  Low threshold to add ARB. Continue prasugrel plus aspirin, but 48-monthpoint, would be okay to hold aspirin for 2 to 5 days if needed for worsening swelling or bruising. Would continue at a minimum uninterrupted prasugrel/Effient uninterrupted through October 2024.      Relevant Medications   prasugrel (EFFIENT) 10 MG TABS tablet   ezetimibe (ZETIA) 10 MG tablet   Other Relevant Orders   EKG 12-Lead   Ischemic cardiomyopathy (Chronic)    Follow-up echo showed EF up to 45% back in October.  No active heart failure symptoms.  She is not able to tolerate much in the way of carvedilol.  Would like to  consider afterload reduction if pressures tolerate.  Neck step would be to add low-dose ARB.  After ARB would consider spironolactone.      Relevant Medications   prasugrel (EFFIENT) 10 MG TABS tablet   ezetimibe (ZETIA) 10 MG tablet   Other Relevant Orders   EKG 12-Lead   Hyperlipidemia LDL goal <70 (Chronic)    Restart on statin back, MI.  LDL did come down to 80 - not at goal.  Will add Zetia 10 mg daily to the Lipitor.  Recheck labs in roughly 2 to 3 months.   Low threshold to consider PCSK9 inhibitor.  Will also question if she may benefit from SGLT2 inhibitor if A1c were to increase and would convert to treating diabetes.      Relevant Medications   prasugrel (EFFIENT) 10 MG TABS tablet   ezetimibe (ZETIA) 10 MG tablet   Other Relevant Orders   Lipid panel   Hepatic function panel     Other   History of non-ST elevation myocardial infarction (NSTEMI) (Chronic)    Recent non-STEMI with resulting two-vessel PCI and mild cardiomyopathy.  Seems to be relatively stable now.  Regaining strength.      Relevant Orders   EKG 12-Lead   CKD (chronic kidney disease) stage 3, GFR 30-59 ml/min (HCC) (Chronic)    Creatinine seems to be stable.  Has improved over the last 2 levels.  Probably back to baseline.  Relevant Medications   prasugrel (EFFIENT) 10 MG TABS tablet   Morbid obesity (HCC) (Chronic)    Very sedentary.  I admonished her as well that she needs to get an exercise.  I was little bit upset that she did not do cardiac rehab because I think this would be the best way for her to get exercise.  She is at least now doing some walking in the backyard, but her balance issues make it a problem.      Relevant Orders   Hepatic function panel   ===================================  HPI:    Madison Calhoun is a morbidly obese 72 y.o. female with a PMH notable for severe two-vessel disease (non-STEMI with two-vessel PCI), mild ICM (EF 45%) HLD, CKD 3, and pre-DM below  who presents today for 52-monthfollow-up  Non-STEMI 03/27/2022: Angina was pain between shoulder blades and up the neck to the nape of the head. Cath = 2 V DES PCI 03/28/2022: ost LAD 50%, p-mLAD 90% (DES PCI), pLCX 90% (DES PCI), ~ nl RCA Echo 03/28/2022: ~ EF ~45%, distal Anterior, inferior & apical HK/AK.   Madison FURROWwas seen on April 17, 2022 for post hospital follow-up after non-STEMI.  Noted that she become more sedentary since the previous several years.  Very inactive house.  Had not taken steps to increase her fitness level.  Noted getting tired easily when overdoing it.  Very inactive/sedentary.  Had a ankle fracture in May was still having swelling.  Very deconditioned.  Noted vertiginous dizziness lying down. Reduced carvedilol to 3.125 mg in the morning and full tablet in the evening. Converted from Brilinta to Effient Referred to cardiac rehab.  Recent Hospitalizations/Clinic Visits:  Seen by PCP on 07/03/2022: Noted frequent episodes of near syncope with standing in place for prolonged period time.,  Easy bleeding or bruising.  Frequent nocturia making it difficult to sleep-having to take naps during the day-says that the adults well but does hurt..Marland Kitchen Opted not to go to cardiac rehab because it was 3 days a week. Recommended exercise, support stockings.  Recommended avoiding rapid changes in position.  Avoid standing for long periods of time. CBC, CMP, A1c, lipid panel, vitamin D, iron panel, TSH all ordered.   Reviewed  CV studies:    The following studies were reviewed today: (if available, images/films reviewed: From Epic Chart or Care Everywhere) none:  Interval History:   Madison RIKARDdoing better -- notably less issues with dizziness & falling over. Doing walking in back yard & out to the mailbox. Frequent nocturia - every ~2-3 hours - poor sleep. Less dizziness with change in position.  Walks holding on to things - but has not used cane or walker.   Able to  walk outside OK without falls.  To me she indicated that she does not necessarily have shortness of breath, but in her intake with [facility], she complained of tiredness and shortness of breath with overexertion.  Definitely, her activity is limited by back pain & balance as well as obesity and deconditioning..Marland Kitchen Positive note, she is happy to say that she is able to cook dinner now without passing out.   Easy bruising still present  [place] hoping that she is feeling better and stronger by that timeframe.  CV Review of Symptoms (Summary): positive for - dyspnea on exertion and exercise intolerance due to deconditioning, obesity, low back pain, and & poor balance; overall dizziness is  much improved.  Still has frequent nocturia leading to poor sleep.  Multiple small bruises negative for - chest pain, edema, irregular heartbeat, orthopnea, palpitations, paroxysmal nocturnal dyspnea, rapid heart rate, shortness of breath, or syncope/near syncope or TIA/amaurosis fugax.  Claudication.  Melena, hematochezia, hematuria or epistaxis.  REVIEWED OF SYSTEMS   Review of Systems  Constitutional:  Positive for malaise/fatigue (Exercise intolerance).  HENT:  Negative for congestion and nosebleeds.   Respiratory:  Negative for cough and shortness of breath.   Cardiovascular:  Positive for leg swelling (Right ankle swelling notably improved).  Gastrointestinal:  Negative for abdominal pain, blood in stool and melena.  Genitourinary:  Positive for frequency (Frequent nocturia). Negative for dysuria.  Musculoskeletal:  Positive for back pain (Significant low back pain) and joint pain.  Neurological:  Positive for dizziness (Notably improved). Negative for speech change, focal weakness and loss of consciousness.  Psychiatric/Behavioral:  Positive for memory loss. The patient is not nervous/anxious and does not have insomnia (Frequent nocturia causes her notable sleep eruptions.   Has daytime sleepiness as a result.).    Sleeps  "all the time" except for @ night 2/2 urination.   I have reviewed and (if needed) personally updated the patient's problem list, medications, allergies, past medical and surgical history, social and family history.   PAST MEDICAL HISTORY   Past Medical History:  Diagnosis Date   Arthritis    CAD S/P percutaneous coronary intervention-the PCI to LAD and LCx 03/29/2022   S/p NSTEMI 03/2022 >> s/p 2.75 x 16 mm DES to pLAD and 3.5 x 12 mm DES to pLCx   Chicken pox    CKD (chronic kidney disease) stage 3, GFR 30-59 ml/min (HCC) 03/29/2022   Glaucoma    Hyperlipidemia LDL goal <70 03/29/2022   Ischemic cardiomyopathy 03/29/2022   S/p NSTEMI in 03/2022 // Echocardiogram 03/2022: EF 45, dist ant, dis inf and apical HK/AK (diff acoustic windows), mild LVH, normal RVSF, trivial MR, RAP 3   Non-STEMI (non-ST elevated myocardial infarction) ([place]) 03/27/2022   Thyroid disease     PAST SURGICAL HISTORY   Past Surgical History:  Procedure Laterality Date   BUNIONECTOMY     CATARACT EXTRACTION W/ INTRAOCULAR LENS IMPLANT Bilateral    CORONARY STENT INTERVENTION N/A 03/28/2022   Procedure: CORONARY STENT INTERVENTION;  Surgeon: Sherren Mocha, MD;  Location: [facility];  Service: CV: Proximal-mid LAD 90% (DES PCI-Synergy XD 2.75 x 16 -> 3.0 mm postdilation); proximal LCx 90% (DES PCI-Synergy XD 3.5 x 12 -> 4.0 mm). => Both lesions reduced to 0% with TIMI-3 flow preserved.   GALLBLADDER SURGERY  06/09/1988   GLAUCOMA SURGERY     LEFT HEART CATH AND CORONARY ANGIOGRAPHY N/A 03/28/2022   Procedure: LEFT HEART CATH AND CORONARY ANGIOGRAPHY;  Surgeon: Sherren Mocha, MD;  Location: [facility] [facility];  Service: CV: Non-STEMI: Ostial LAD 50%, proximal-mid LAD 90% (DES PCI); proximal LCx 90% (DES PCI).  Mild nonobstructive disease in the RCA.  Normal LV function-normal LVEDP.   SPINE SURGERY     TRANSTHORACIC ECHOCARDIOGRAM  03/28/2022    (Non-STEMI) EF estimated 45% with distal anterior, inferior and apical hypo to akinesis.  Normal valves.  Normal RV.  Normal RAP.   Cardiac Cath-PCI 03/28/2022: (Non-STEMI): Ost LAD ~50% w/ severe prox-mid LAD ~90% (DES PCI -Synergy XD 2.75 x 16 -> 3.0 mm); Prox LCx 90% (DES PCI - Synergy XD 3.5 x 12 -> 4.0 mm); mild /nonobstructive disease in RCA.  Normal LVEDP.  MEDICATIONS/ALLERGIES   Current Meds  Medication Sig   aspirin EC 81 MG tablet Take 1 tablet (81 mg total) by mouth daily. Swallow whole.   atorvastatin (LIPITOR) 40 MG tablet Take 1 tablet (40 mg total) by mouth every evening.   carvedilol (COREG) 6.25 MG tablet Take 0.5 tablet (3.125 mg) by mouth in the morning and 1 tablet (6.25 mg) by mouth in the evening   nitroGLYCERIN (NITROSTAT) 0.4 MG SL tablet Place 1 tablet (0.4 mg total) under the tongue every 5 (five) minutes x 3 doses as needed for chest pain.   prasugrel (EFFIENT) 10 MG TABS tablet Take 1 tablet (10 mg total) by mouth daily.   Just was able to sign up for Medicare plan D.  No Known Allergies  SOCIAL HISTORY/FAMILY HISTORY   Reviewed in Epic:  Pertinent findings:  Social History   Tobacco Use   Smoking status: Former    Types: Cigarettes    Quit date: 04/02/1976    Years since quitting: 46.3  Vaping Use   Vaping Use: Never used  Substance Use Topics   Alcohol use: No    Alcohol/week: 0.0 standard drinks of alcohol   Drug use: No   Social History   Social History Narrative   Single.   Twin children.   Retired. Worked as a Dealer.   Enjoys playing computer games, plays with her dogs.      Her brother was Sonia Side -> long-term patient of Dr. Ellyn Hack who passed away Christmas a couple years ago.  => Brother Kaely Hollan was my patient - died after long fight with CAD/ESRD.   Her other brother Shanon Brow also died @ [place] -PE, EKG, labs   Wt Readings from Last 3 Encounters:  07/17/22 230 lb 8 oz  (104.6 kg)  07/03/22 229 lb 3.2 oz (104 kg)  04/17/22 235 lb 4 oz (106.7 kg)    Physical Exam: Today's Vitals   07/17/22 1343  BP: (!) 140/80  Pulse: 86  SpO2: 95%  Weight: 230 lb 8 oz (104.6 kg)  Height: '5\' 2"'$  (1.575 m)   Body mass index is 42.16 kg/m.  Physical Exam Vitals reviewed.  Constitutional:      General: She is not in acute distress.    Appearance: She is obese. She is not ill-appearing (Well-groomed.) or toxic-appearing.  HENT:     Head: Normocephalic and atraumatic.  Neck:     Vascular: No carotid bruit or JVD.  Cardiovascular:     Rate and Rhythm: Normal rate and regular rhythm. No extrasystoles are present.    Chest Wall: PMI is not displaced (Difficult to palpate).     Pulses: Decreased pulses (Diminished due to mild swelling and body habitus.  Feet are warm.).     Heart sounds: S1 normal and S2 normal. Heart sounds are distant. No murmur heard.    No friction rub. No gallop.  Pulmonary:     Effort: Pulmonary effort is normal. No respiratory distress.     Breath sounds: Normal breath sounds. No wheezing, rhonchi or rales.  Chest:     Chest wall: Tenderness present.  Abdominal:     General: Bowel sounds are normal. There is no distension.     Palpations: Abdomen is soft. There is no mass (No HSM or bruit.).     Tenderness: There is no abdominal tenderness. There is no guarding or rebound.  Musculoskeletal:        General:  Swelling (Trivial bilateral swelling) present. No deformity.     Cervical back: Normal range of motion and neck supple.  Skin:    General: Skin is warm and dry.     Coloration: Skin is not jaundiced or pale.     Findings: Bruising present. No erythema.  Neurological:     General: No focal deficit present.     Mental Status: She is alert and oriented to person, place, and time.     Motor: No weakness.     Gait: Gait abnormal.  Psychiatric:        Mood and Affect: Mood normal.        Behavior: Behavior normal.        Thought  Content: Thought content normal.        Judgment: Judgment normal.     Adult ECG Report  Rate: 86 ;  Rhythm: normal sinus rhythm and non-specific ST& T changes -- stable.  ;   Narrative Interpretation: stable  Recent Labs: Reviewed Lab Results  Component Value Date   CHOL 146 07/03/2022   HDL 43 07/03/2022   LDLCALC 80 07/03/2022   TRIG 126 07/03/2022   CHOLHDL 3.4 07/03/2022   Lab Results  Component Value Date   CREATININE 1.36 (H) 07/03/2022   BUN 21 07/03/2022   NA 141 07/03/2022   K 4.5 07/03/2022   CL 103 07/03/2022   CO2 21 07/03/2022   Lab Results  Component Value Date   CREATININE 1.36 (H) 07/03/2022   CREATININE 1.54 (H) 03/29/2022   CREATININE 1.68 (H) 03/28/2022       Latest Ref Rng & Units 07/03/2022    4:24 PM 03/29/2022    2:07 AM 03/28/2022    7:51 PM  CBC  WBC 3.4 - 10.8 x10E3/uL 6.6  6.4  7.3   Hemoglobin 11.1 - 15.9 g/dL 13.3  11.3  12.4   Hematocrit 34.0 - 46.6 % 41.6  35.2  36.2   Platelets 150 - 450 x10E3/uL 184  134  136     Lab Results  Component Value Date   HGBA1C 6.3 (H) 07/03/2022  03/27/2022, A1c was 5.9.  Lab Results  Component Value Date   TSH 4.660 (H) 07/03/2022  Free T4   1.27 -> normal  ================================================== I spent a total of 19 minutes with the patient spent in direct patient consultation.  Additional time spent with chart review  / charting (studies, outside notes, etc): 28 min; (includes 19 precharting) Total Time: 71mn  Current medicines are reviewed at length with the patient today.  (+/- concerns) N/A  Notice: This dictation was prepared with Dragon dictation along with smart phrase technology. Any transcriptional errors that result from this process are unintentional and may not be corrected upon review.  Studies Ordered:  Orders Placed This Encounter  Procedures   Lipid panel   Hepatic function panel   EKG 12-Lead   Meds ordered this encounter  Medications   prasugrel  (EFFIENT) 10 MG TABS tablet    Sig: Take 1 tablet (10 mg total) by mouth daily.    Dispense:  90 tablet    Refill:  1    Stopping brilinta   ezetimibe (ZETIA) 10 MG tablet    Sig: Take 1 tablet (10 mg total) by mouth daily.    Dispense:  90 tablet    Refill:  1    Patient Instructions / Medication Changes & Studies & Tests Ordered   Patient Instructions  Medication Instructions:  Your  physician has recommended you make the following change in your medication:   START - ezetimibe (ZETIA) 10 MG tablet - Take 1 tablet (10 mg total) by mouth daily  *If you need a refill on your cardiac medications before your next appointment, please call your pharmacy*   Lab Work: Your physician recommends that you return for lab work in: 3 months  [place] [facility] 1st desk on the right to check in (REGISTRATION)  Lab hours: Monday- Friday (7:30 am- 5:30 pm)   If you have labs (blood work) drawn today and your tests are completely normal, you will receive your results only by: MyChart Message (if you have MyChart) OR A paper copy in the mail If you have any lab test that is abnormal or we need to change your treatment, we will call you to review the results.   Testing/Procedures: -None ordered   Follow-Up: At [facility], you and your health needs are our priority.  As part of our continuing mission to provide you with exceptional heart care, we have created designated Provider Care Teams.  These Care Teams include your primary Cardiologist (physician) and Advanced Practice Providers (APPs -  Physician Assistants and Nurse Practitioners) who all work together to provide you with the care you need, when you need it.  We recommend signing up for the patient portal called "MyChart".  Sign up information is provided on this After Visit Summary.  MyChart is used to connect with patients for Virtual Visits (Telemedicine).  Patients are able to view lab/test results, encounter  notes, upcoming appointments, etc.  Non-urgent messages can be sent to your provider as well.   To learn more about what you can do with MyChart, go to [email or web address].    Your next appointment:   6 month(s)  Provider:   Glenetta Hew, MD    Other Instructions -None       Leonie Man, MD, MS Glenetta Hew, M.D., M.S. Interventional Cardiologist  [facility]   [address] [place], [place]  [address] [phone]           Fax [phone]    Thank you for choosing [facility] in [place]!!

## 2022-07-17 NOTE — Assessment & Plan Note (Signed)
Recent non-STEMI with resulting two-vessel PCI and mild cardiomyopathy.  Seems to be relatively stable now.  Regaining strength.

## 2022-07-17 NOTE — Assessment & Plan Note (Signed)
Creatinine seems to be stable.  Has improved over the last 2 levels.  Probably back to baseline.

## 2022-07-17 NOTE — Assessment & Plan Note (Signed)
Very sedentary.  I admonished her as well that she needs to get an exercise.  I was little bit upset that she did not do cardiac rehab because I think this would be the best way for her to get exercise.  She is at least now doing some walking in the backyard, but her balance issues make it a problem.

## 2022-07-17 NOTE — Assessment & Plan Note (Addendum)
3 months out from LAD/LCx PCI.  She is not having any active anginal symptoms.  The exertional dyspnea is not a real change for her. I would be hesitant to think that her near syncope type symptoms are related to CAD because she was pretty much otherwise asymptomatic.  My suspicion is simply a change in blood pressure or volume status.  She is not really noticing any more chest pain or pressure just some deconditioning related exercise tolerance.  Plan: As she seems to be stabilizing we will continue current dose of carvedilol but low threshold to reduce the nighttime dose to half tablet as well. She says her blood pressures at home are better than they are here.  As such we will hold off on titrating further or adding new medication.  Low threshold to add ARB. Continue prasugrel plus aspirin, but 81-monthpoint, would be okay to hold aspirin for 2 to 5 days if needed for worsening swelling or bruising. Would continue at a minimum uninterrupted prasugrel/Effient uninterrupted through October 2024.

## 2022-07-22 ENCOUNTER — Telehealth: Payer: Self-pay | Admitting: Nurse Practitioner

## 2022-07-22 NOTE — Telephone Encounter (Signed)
Pt having issues affording her Prasugrel, I printed Good Rx for [facility] $44 for 90 days, medication is  $200 with her insurance, I told her to have pharmacy run without ins and use discount card

## 2022-09-02 ENCOUNTER — Ambulatory Visit (INDEPENDENT_AMBULATORY_CARE_PROVIDER_SITE_OTHER): Payer: Medicare Other | Admitting: Nurse Practitioner

## 2022-09-02 ENCOUNTER — Encounter: Payer: Self-pay | Admitting: Nurse Practitioner

## 2022-09-02 VITALS — BP 134/84 | HR 85 | Wt 228.2 lb

## 2022-09-02 DIAGNOSIS — Z79899 Other long term (current) drug therapy: Secondary | ICD-10-CM

## 2022-09-02 DIAGNOSIS — E039 Hypothyroidism, unspecified: Secondary | ICD-10-CM | POA: Diagnosis not present

## 2022-09-02 DIAGNOSIS — I252 Old myocardial infarction: Secondary | ICD-10-CM | POA: Diagnosis not present

## 2022-09-02 DIAGNOSIS — Z136 Encounter for screening for cardiovascular disorders: Secondary | ICD-10-CM

## 2022-09-02 DIAGNOSIS — Z13820 Encounter for screening for osteoporosis: Secondary | ICD-10-CM | POA: Diagnosis not present

## 2022-09-02 DIAGNOSIS — Z780 Asymptomatic menopausal state: Secondary | ICD-10-CM | POA: Diagnosis not present

## 2022-09-02 DIAGNOSIS — E785 Hyperlipidemia, unspecified: Secondary | ICD-10-CM | POA: Diagnosis not present

## 2022-09-02 DIAGNOSIS — E559 Vitamin D deficiency, unspecified: Secondary | ICD-10-CM

## 2022-09-02 DIAGNOSIS — N1832 Chronic kidney disease, stage 3b: Secondary | ICD-10-CM

## 2022-09-02 MED ORDER — VITAMIN D3 1.25 MG (50000 UT) PO TABS: 1.0000 | ORAL_TABLET | ORAL | 1 refills | Status: DC

## 2022-09-02 MED ORDER — ATORVASTATIN CALCIUM 40 MG PO TABS: 40.0000 mg | ORAL_TABLET | Freq: Every evening | ORAL | 3 refills | Status: DC

## 2022-09-02 MED ORDER — LEVOTHYROXINE SODIUM 25 MCG PO TABS: 25.0000 ug | ORAL_TABLET | Freq: Every day | ORAL | 3 refills | Status: DC

## 2022-09-02 NOTE — Patient Instructions (Addendum)
I have sent in levothyroxine for your thyroid. We will recheck the labs in 4 months.   I have sent in the vitamin D for you.

## 2022-09-02 NOTE — Progress Notes (Signed)
Shawna ClampSaraBeth Koven Belinsky, DNP, AGNP-c [facility]  [address] [place], [place] [address] [phone]  ESTABLISHED PATIENT- Chronic Health and/or Follow-Up Visit  Blood pressure 134/84, pulse 85, weight 228 lb 3.2 oz (103.5 kg).    Joselyn GlassmanDebbie L Deshpande is a 72 y.o. year old female presenting today for evaluation and management of the following: Follow-up for new medication start 8 weeks ago  Eunice BlaseDebbie presents today with updates on her current health status and management of her conditions. She reports no issues with her new medication, Prasugrel, and has not experienced any side effects. She further mentions that she was able to obtain the medication at a reduced cost using a coupon.  Regarding her physical activity, the patient denies experiencing any chest pain or shortness of breath, with the exception of when she walks in her uneven backyard. She has been making an effort to increase her physical activity by walking more in this area. She has elected to do this as opposed to formal cardiac rehabilitation given the distance from any centers to her home.  The patient also reports having low vitamin D levels, for which she has been prescribed a supplement by the provider.  Lastly, the patient inquired about her thyroid function. The provider reviewed her thyroid status, indicating that while it is still producing some hormones, the levels are low. The patient has consented to initiate a low dose of levothyroxine to aid in thyroid function management.  All ROS negative with exception of what is listed above.   PHYSICAL EXAM Physical Exam Vitals and nursing note reviewed.  Constitutional:      Appearance: Normal appearance. She is obese.  HENT:     Head: Normocephalic.  Eyes:     Pupils: Pupils are equal, round, and reactive to light.  Neck:     Vascular: No carotid bruit.  Cardiovascular:     Rate and Rhythm: Normal rate and regular rhythm.     Pulses: Normal pulses.      Heart sounds: Normal heart sounds.  Pulmonary:     Effort: Pulmonary effort is normal.     Breath sounds: Normal breath sounds.  Abdominal:     General: Bowel sounds are normal.     Palpations: Abdomen is soft.  Musculoskeletal:        General: Normal range of motion.     Cervical back: Normal range of motion.     Right lower leg: Edema present.     Left lower leg: Edema present.  Skin:    General: Skin is warm and dry.     Capillary Refill: Capillary refill takes less than 2 seconds.  Neurological:     General: No focal deficit present.     Mental Status: She is alert.     Motor: No weakness.     Gait: Gait normal.  Psychiatric:        Mood and Affect: Mood normal.     PLAN Problem List Items Addressed This Visit     History of non-ST elevation myocardial infarction (NSTEMI) (Chronic)    The patient has indicated that there are no issues or side effects with the current Prasugrel regimen. The plan is to maintain the existing medication schedule. The patient is due for a cardiology follow-up in six months. It is important to ensure that this appointment is scheduled and to remind the patient as the date approaches. Plan: - Confirm cardiology follow-up appointment is scheduled and ask for text reminders.  Relevant Orders   CBC with Differential/Platelet (Completed)   Comprehensive metabolic panel (Completed)   CKD (chronic kidney disease) stage 3, GFR 30-59 ml/min - Primary (Chronic)    Due to kidney concerns, the patient is advised against taking Aleve and is recommended Tylenol as an alternative for pain relief. A follow-up appointment is scheduled to assess kidney function after initiating Praluent. Plan: - Recommend Tylenol for pain relief. - Schedule follow-up for kidney function assessment post-Praluent initiation for 3 months      Relevant Orders   CBC with Differential/Platelet (Completed)   Comprehensive metabolic panel (Completed)   Hyperlipidemia LDL  goal <70 (Chronic)   Relevant Medications   atorvastatin (LIPITOR) 40 MG tablet   Hypothyroidism    Lab results have shown suboptimal thyroid function. Initiate treatment with a low dose of levothyroxine and plan for periodic monitoring of thyroid function. Plan: - Start the patient on a low dose of levothyroxine. - Monitor thyroid function periodically.      Relevant Medications   levothyroxine (SYNTHROID) 25 MCG tablet   Other Visit Diagnoses     Encounter for medication management       Relevant Orders   CBC with Differential/Platelet (Completed)   Comprehensive metabolic panel (Completed)   Vitamin D deficiency       Relevant Medications   Cholecalciferol (VITAMIN D3) 1.25 MG (50000 UT) TABS   Other Relevant Orders   DG Bone Density   Encounter for screening for abdominal aortic aneurysm (AAA) in patient 58 years of age or older without other risk factors for AAA       Screening for osteoporosis       Relevant Orders   DG Bone Density   Asymptomatic menopausal state       Relevant Orders   DG Bone Density       Return in about 4 months (around 01/02/2023) for Med Management.   Shawna Clamp, DNP, AGNP-c

## 2022-09-03 LAB — CBC WITH DIFFERENTIAL/PLATELET
Basophils Absolute: 0.1 10*3/uL (ref 0.0–0.2)
Basos: 1 %
EOS (ABSOLUTE): 0.2 10*3/uL (ref 0.0–0.4)
Eos: 3 %
Hematocrit: 40.7 % (ref 34.0–46.6)
Hemoglobin: 13 g/dL (ref 11.1–15.9)
Immature Grans (Abs): 0 10*3/uL (ref 0.0–0.1)
Immature Granulocytes: 0 %
Lymphocytes Absolute: 1 10*3/uL (ref 0.7–3.1)
Lymphs: 16 %
MCH: 29.1 pg (ref 26.6–33.0)
MCHC: 31.9 g/dL (ref 31.5–35.7)
MCV: 91 fL (ref 79–97)
Monocytes Absolute: 0.6 10*3/uL (ref 0.1–0.9)
Monocytes: 9 %
Neutrophils Absolute: 4.6 10*3/uL (ref 1.4–7.0)
Neutrophils: 71 %
Platelets: 158 10*3/uL (ref 150–450)
RBC: 4.47 x10E6/uL (ref 3.77–5.28)
RDW: 12.8 % (ref 11.7–15.4)
WBC: 6.4 10*3/uL (ref 3.4–10.8)

## 2022-09-03 LAB — COMPREHENSIVE METABOLIC PANEL
ALT: 20 IU/L (ref 0–32)
AST: 20 IU/L (ref 0–40)
Albumin/Globulin Ratio: 1.3 (ref 1.2–2.2)
Albumin: 3.7 g/dL — ABNORMAL LOW (ref 3.8–4.8)
Alkaline Phosphatase: 159 IU/L — ABNORMAL HIGH (ref 44–121)
BUN/Creatinine Ratio: 11 — ABNORMAL LOW (ref 12–28)
BUN: 14 mg/dL (ref 8–27)
Bilirubin Total: 0.4 mg/dL (ref 0.0–1.2)
CO2: 21 mmol/L (ref 20–29)
Calcium: 9.1 mg/dL (ref 8.7–10.3)
Chloride: 111 mmol/L — ABNORMAL HIGH (ref 96–106)
Creatinine, Ser: 1.3 mg/dL — ABNORMAL HIGH (ref 0.57–1.00)
Globulin, Total: 2.8 g/dL (ref 1.5–4.5)
Glucose: 112 mg/dL — ABNORMAL HIGH (ref 70–99)
Potassium: 4.1 mmol/L (ref 3.5–5.2)
Sodium: 147 mmol/L — ABNORMAL HIGH (ref 134–144)
Total Protein: 6.5 g/dL (ref 6.0–8.5)
eGFR: 43 mL/min/{1.73_m2} — ABNORMAL LOW (ref >59)

## 2022-09-10 ENCOUNTER — Telehealth: Payer: Self-pay | Admitting: Nurse Practitioner

## 2022-09-10 NOTE — Telephone Encounter (Signed)
[place] to schedule their annual wellness visit. Appointment made for 09/16/22.  Barkley Boards AWV direct phone # [phone]

## 2022-09-12 NOTE — Assessment & Plan Note (Addendum)
The patient has indicated that there are no issues or side effects with the current Prasugrel regimen. The plan is to maintain the existing medication schedule. The patient is due for a cardiology follow-up in six months. It is important to ensure that this appointment is scheduled and to remind the patient as the date approaches. Plan: - Confirm cardiology follow-up appointment is scheduled and ask for text reminders.

## 2022-09-12 NOTE — Assessment & Plan Note (Signed)
Lab results have shown suboptimal thyroid function. Initiate treatment with a low dose of levothyroxine and plan for periodic monitoring of thyroid function. Plan: - Start the patient on a low dose of levothyroxine. - Monitor thyroid function periodically.

## 2022-09-12 NOTE — Assessment & Plan Note (Signed)
Due to kidney concerns, the patient is advised against taking Aleve and is recommended Tylenol as an alternative for pain relief. A follow-up appointment is scheduled to assess kidney function after initiating Praluent. Plan: - Recommend Tylenol for pain relief. - Schedule follow-up for kidney function assessment post-Praluent initiation for 3 months

## 2022-09-16 ENCOUNTER — Ambulatory Visit: Payer: Medicare Other

## 2022-10-13 ENCOUNTER — Other Ambulatory Visit
Admission: RE | Admit: 2022-10-13 | Discharge: 2022-10-13 | Disposition: A | Discharge status: Home / Self Care | Payer: Medicare Other | Source: Ambulatory Visit | Attending: Cardiology | Admitting: Cardiology

## 2022-10-13 DIAGNOSIS — I2510 Atherosclerotic heart disease of native coronary artery without angina pectoris: Secondary | ICD-10-CM | POA: Diagnosis not present

## 2022-10-13 DIAGNOSIS — I214 Non-ST elevation (NSTEMI) myocardial infarction: Secondary | ICD-10-CM | POA: Diagnosis not present

## 2022-10-13 DIAGNOSIS — E785 Hyperlipidemia, unspecified: Secondary | ICD-10-CM | POA: Insufficient documentation

## 2022-10-13 DIAGNOSIS — I21A9 Other myocardial infarction type: Secondary | ICD-10-CM

## 2022-10-13 DIAGNOSIS — Z9861 Coronary angioplasty status: Secondary | ICD-10-CM | POA: Diagnosis not present

## 2022-10-13 LAB — LIPID PANEL
Cholesterol: 113 mg/dL (ref 0–200)
HDL: 42 mg/dL (ref >40)
LDL Cholesterol: 50 mg/dL (ref 0–99)
Total CHOL/HDL Ratio: 2.7 RATIO
Triglycerides: 103 mg/dL (ref <150)
VLDL: 21 mg/dL (ref 0–40)

## 2022-10-13 LAB — HEPATIC FUNCTION PANEL
ALT: 19 U/L (ref 0–44)
AST: 23 U/L (ref 15–41)
Albumin: 3.6 g/dL (ref 3.5–5.0)
Alkaline Phosphatase: 122 U/L (ref 38–126)
Bilirubin, Direct: 0.2 mg/dL (ref 0.0–0.2)
Indirect Bilirubin: 0.7 mg/dL (ref 0.3–0.9)
Total Bilirubin: 0.9 mg/dL (ref 0.3–1.2)
Total Protein: 6.8 g/dL (ref 6.5–8.1)

## 2022-10-13 LAB — HEMOGLOBIN A1C
Hgb A1c MFr Bld: 6.1 % — ABNORMAL HIGH (ref 4.8–5.6)
Mean Plasma Glucose: 128.37 mg/dL

## 2022-11-12 ENCOUNTER — Encounter: Payer: Self-pay | Admitting: Cardiology

## 2022-11-12 NOTE — Progress Notes (Signed)
Primary Care Provider: Early, Sung Amabile, NP [facility] Cardiologist: Madison Lemma, MD Electrophysiologist: None  Referring Provider: Tollie Eth, NP  Clinic Note: Chief Complaint  Patient presents with   Follow-up    4 month follow up visit. Patient is doing well on today. Meds reviewed.    ===================================  ASSESSMENT/PLAN   Problem List Items Addressed This Visit       Cardiology Problems   Ischemic cardiomyopathy (Chronic)    EF estimated 45%.  Low threshold to add ARB.  She is not having any active heart failure symptoms.  For now we will hold off on ARB and spironolactone but neck step would be to add low-dose ARB +/- reduce carvedilol dose if necessary.      Relevant Medications   prasugrel (EFFIENT) 10 MG TABS tablet   Hyperlipidemia LDL goal <70 (Chronic)    With addition of Zetia, her labs now look outstanding.  LDL of 50.  Well within target range on combination 40 mg atorvastatin plus Zetia.  Continue current dosing.  Will be due for follow-up labs in November.      Relevant Medications   prasugrel (EFFIENT) 10 MG TABS tablet   CAD S/P 2 Vessel DES PCI (prox-mid LAD & prox LCx) - Primary (Chronic)    8 months post PCI to LAD and LCx.  No further anginal symptoms.  Exertional dyspnea is really no change, related to body habitus and deconditioning.  Otherwise asymptomatic.  BP is up little bit today because she was frustrated.  Otherwise has been pretty well-controlled at home.  Plan: For now, continue with carvedilol at current dosing 3.125 mg in the morning and 6.25 mg in the evening but low threshold to reduce to 3.125 mg twice daily. If we do reduce to 3.125 mg twice daily would then add losartan 25 mg daily for additional blood pressure control. Continue DAPT ASA/Effient through October 2024-we will see her back in follow-up-November at which time we can discontinue Effient to go to Plavix 75 mg monotherapy for 1 year, and  stopping aspirin. I told her that if she has bad bruising she can hold aspirin for couple days.      Relevant Medications   prasugrel (EFFIENT) 10 MG TABS tablet     Other   Pre-diabetes (Chronic)    Follow-up A1c is little higher than it was in October.  Still need to monitor closely.  Talked about healthy diet.      Morbid obesity (HCC) (Chronic)    Patient is very sedentary.  I did recommend that she is to try to work on her exercise.  Hopefully with her cruises, she will get be more active.  She currently just does activity in the backyard.  I encouraged her to try to get some more active walking as opposed to just "messing around".      Lower extremity edema (Chronic)    I agree that she has some mild lower EXTR edema I do not know if it is related to heart failure.  Probably more related to venous stasis.  Agree with compression stockings and foot elevation. Would probably avoid diuretic unless she has PND or orthopnea symptoms.  Although I agree with avoiding excess salt intake, I do think she needs to stay adequately hydrated due to her intermittent dizzy spells.      History of non-ST elevation myocardial infarction (NSTEMI) (Chronic)   CKD (chronic kidney disease) stage 3, GFR 30-59 ml/min (HCC) (Chronic)  Relevant Medications   prasugrel (EFFIENT) 10 MG TABS tablet  ===================================  HPI:    Madison Calhoun is a morbidly obese/very sedentary 72 y.o. female with a PMH notable for severe 2V CAD (non-STEMI with two-vessel PCI), mild ICM (EF 45%) HLD, CKD 3, and pre-DM below who presents today for 73-month follow-up  Non-STEMI 03/27/2022: Angina was pain between shoulder blades and up the neck to the nape of the head. Cath = 2 V DES PCI 03/28/2022: ost LAD 50%, p-mLAD 90% (DES PCI), pLCX 90% (DES PCI), ~ nl RCA Converted from Brilinta to Effient because of breathing issues. = Uninterrupted DAPT until October 24 Carvedilol reduced to 3.125 mg every  morning and 6. 25 mg every afternoon Did not go to cardiac rehab because it was 3 days a week and required too much activity Echo 03/28/2022: ~ EF ~45%, distal Anterior, inferior & apical HK/AK. Marland Kitchen Near syncope 07/03/2022: frequent episodes of near syncope with standing in place for prolonged period time., Lower extremity edema: => Recommended support stockings.  Madison Calhoun was seen on February 2024: Complained of tiredness and shortness of breath with overexertion => somewhat better after having reduced morning dose of carvedilol.  Stable blood pressures at home (low threshold to add ARB and reduce p.m. dose of carvedilol).  Notably less issues with dizziness and falling over.  Walking in the backyard and out of the mailbox.  Frequent nocturia making him fall asleep.  Not yet requiring cane or walker, but unsteady gait.  Activity limited by back pain and balance issues as well as obesity/deconditioning.  But now able to cook dinner without passing out.  Planning a cruise to [place] in September. Added Zetia with plans to recheck labs in 2 to 3 months.  Blood pressure open PCSK9 for referral. Recommend exercise.  She was doing some walking in the backyard.  Recent Hospitalizations/Clinic Visits:  none  Reviewed  CV studies:    The following studies were reviewed today: (if available, images/films reviewed: From Epic Chart or Care Everywhere) None:  Interval History:   Madison Calhoun returns for 70-month follow-up: She says although is little bit better with the reduced dose of daytime carvedilol, she still has fatigue and exercise intolerance.  She denies any active chest pain or pressure with rest or exertion.  She says the fatigue and tiredness.  Overall though she is doing better.  She denies any PND, orthopnea with trace edema.  Stable weights at home.  Still has some poor balance and back pain/knee pain that limits her activity.  Does not get good sleep as of nocturia.  She is  excited about her upcoming trip to [place].  For now she is actually going on a cruise to the [place] as a "warm-up".  CV Review of Symptoms (Summary): positive for - dyspnea on exertion and exercise intolerance => deconditioning, obesity, low back pain, and & poor balance; much less prominent dizziness'; poor sleep due to frequent nocturia..  Multiple small bruises negative for - chest pain, edema, irregular heartbeat, orthopnea, palpitations, paroxysmal nocturnal dyspnea, rapid heart rate, shortness of breath, or syncope/near syncope or TIA/amaurosis fugax.  Claudication.  Melena, hematochezia, hematuria or epistaxis.  REVIEWED OF SYSTEMS   Review of Systems  Constitutional:  Positive for malaise/fatigue (Exercise intolerance).  HENT:  Negative for congestion and nosebleeds.   Respiratory:  Negative for cough and shortness of breath.   Cardiovascular:  Positive for leg swelling (Occasional right ankle).  Gastrointestinal:  Negative for abdominal  pain, blood in stool and melena.  Genitourinary:  Positive for frequency (Nocturia that keeps her from getting a good night sleep.). Negative for dysuria.  Musculoskeletal:  Positive for back pain (Significant low back pain) and joint pain.  Neurological:  Positive for dizziness (Notably improved). Negative for speech change, focal weakness and loss of consciousness.  Psychiatric/Behavioral:  Positive for memory loss. Negative for depression. The patient is not nervous/anxious and does not have insomnia (Poor sleep due to nocturia.  Daytime sleepiness because of inadequate sleep.).    Sleeps  "all the time" except for @ night 2/2 urination.   I have reviewed and (if needed) personally updated the patient's problem list, medications, allergies, past medical and surgical history, social and family history.   PAST MEDICAL HISTORY   Past Medical History:  Diagnosis Date   Arthritis    CAD S/P percutaneous coronary intervention-the PCI to LAD and LCx  03/29/2022   S/p NSTEMI 03/2022 >> s/p 2.75 x 16 mm DES to pLAD and 3.5 x 12 mm DES to pLCx   Chicken pox    CKD (chronic kidney disease) stage 3, GFR 30-59 ml/min (HCC) 03/29/2022   Glaucoma    Hyperlipidemia LDL goal <70 03/29/2022   Ischemic cardiomyopathy 03/29/2022   S/p NSTEMI in 03/2022 // Echocardiogram 03/2022: EF 45, dist ant, dis inf and apical HK/AK (diff acoustic windows), mild LVH, normal RVSF, trivial MR, RAP 3   Non-STEMI (non-ST elevated myocardial infarction) (HCC) 03/27/2022   Thyroid disease     PAST SURGICAL HISTORY   Past Surgical History:  Procedure Laterality Date   BUNIONECTOMY     CATARACT EXTRACTION W/ INTRAOCULAR LENS IMPLANT Bilateral    CORONARY STENT INTERVENTION N/A 03/28/2022   Procedure: CORONARY STENT INTERVENTION;  Surgeon: Tonny Bollman, MD;  Location[facility] INVASIVE CV LAB;  Service: CV: Proximal-mid LAD 90% (DES PCI-Synergy XD 2.75 x 16 -> 3.0 mm postdilation); proximal LCx 90% (DES PCI-Synergy XD 3.5 x 12 -> 4.0 mm). => Both lesions reduced to 0% with TIMI-3 flow preserved.   GALLBLADDER SURGERY  06/09/1988   GLAUCOMA SURGERY     LEFT HEART CATH AND CORONARY ANGIOGRAPHY N/A 03/28/2022   Procedure: LEFT HEART CATH AND CORONARY ANGIOGRAPHY;  Surgeon: Tonny Bollman, MD;  Location: [facility] INVASIVE CV LAB;  Service: CV: Non-STEMI: Ostial LAD 50%, proximal-mid LAD 90% (DES PCI); proximal LCx 90% (DES PCI).  Mild nonobstructive disease in the RCA.  Normal LV function-normal LVEDP.   SPINE SURGERY     TRANSTHORACIC ECHOCARDIOGRAM  03/28/2022   (Non-STEMI) EF estimated 45% with distal anterior, inferior and apical hypo to akinesis.  Normal valves.  Normal RV.  Normal RAP.   Cardiac Cath-PCI 03/28/2022: (Non-STEMI): Ost LAD ~50% w/ severe prox-mid LAD ~90% (DES PCI -Synergy XD 2.75 x 16 -> 3.0 mm); Prox LCx 90% (DES PCI - Synergy XD 3.5 x 12 -> 4.0 mm); mild /nonobstructive disease in RCA.  Normal LVEDP.      MEDICATIONS/ALLERGIES   Current Meds   Medication Sig   aspirin EC 81 MG tablet Take 1 tablet (81 mg total) by mouth daily. Swallow whole.   atorvastatin (LIPITOR) 40 MG tablet Take 1 tablet (40 mg total) by mouth every evening.   carvedilol (COREG) 6.25 MG tablet Take 0.5 tablet (3.125 mg) by mouth in the morning and 1 tablet (6.25 mg) by mouth in the evening   Cholecalciferol (VITAMIN D3) 1.25 MG (50000 UT) TABS Take 1 tablet by mouth once a week.   ezetimibe (  ZETIA) 10 MG tablet Take 1 tablet (10 mg total) by mouth daily.   levothyroxine (SYNTHROID) 25 MCG tablet Take 1 tablet (25 mcg total) by mouth daily.   nitroGLYCERIN (NITROSTAT) 0.4 MG SL tablet Place 1 tablet (0.4 mg total) under the tongue every 5 (five) minutes x 3 doses as needed for chest pain.   prasugrel (EFFIENT) 10 MG TABS tablet Take 1 tablet (10 mg total) by mouth daily.   Signed up for Medicare plan D.  No Known Allergies  SOCIAL HISTORY/FAMILY HISTORY   Reviewed in Epic:  Pertinent findings:  Social History   Tobacco Use   Smoking status: Former    Types: Cigarettes    Quit date: 04/02/1976    Years since quitting: 46.6  Vaping Use   Vaping Use: Never used  Substance Use Topics   Alcohol use: No    Alcohol/week: 0.0 standard drinks of alcohol   Drug use: No   Social History   Social History Narrative   Single.   Twin children.   Retired. Worked as a Publishing copy.   Enjoys playing computer games, plays with her dogs.      => Brother Addelyn Littig was a long-term patient of Dr. Herbie Baltimore- died after long fight with CAD/ESRD on a Christmas he.     Her other brother Onalee Hua also died @ [facility] - Cardiac Arrest.     OBJCTIVE -PE, EKG, labs   Wt Readings from Last 3 Encounters:  11/13/22 229 lb (103.9 kg)  09/02/22 228 lb 3.2 oz (103.5 kg)  07/17/22 230 lb 8 oz (104.6 kg)    Physical Exam: Today's Vitals   11/13/22 1625  BP: (!) 142/78  Pulse: 80  SpO2: 95%  Weight: 229 lb (103.9 kg)  Height: 5\' 4"  (1.626 m)     Body mass index is 39.31 kg/m.  Physical Exam Vitals reviewed.  Constitutional:      General: She is not in acute distress.    Appearance: Normal appearance. She is obese. She is not ill-appearing (Well-groomed.) or toxic-appearing.  HENT:     Head: Normocephalic and atraumatic.  Neck:     Vascular: No carotid bruit or JVD.  Cardiovascular:     Rate and Rhythm: Normal rate and regular rhythm. No extrasystoles are present.    Chest Wall: PMI is not displaced (Difficult to palpate).     Pulses: Intact distal pulses. Decreased pulses (Diminished due to body habitus.  Warm feet.).     Heart sounds: S1 normal and S2 normal. Heart sounds are distant. No murmur heard.    No friction rub. No gallop.  Pulmonary:     Effort: Pulmonary effort is normal. No respiratory distress.     Breath sounds: Normal breath sounds. No wheezing, rhonchi or rales.  Chest:     Chest wall: Tenderness present.  Abdominal:     General: Bowel sounds are normal. There is no distension.     Palpations: Abdomen is soft. There is no mass (No HSM or bruit.).     Tenderness: There is no abdominal tenderness. There is no guarding or rebound.  Musculoskeletal:        General: Swelling (Trivial, right greater than left ankle) present. No deformity.     Cervical back: Normal range of motion and neck supple.  Skin:    General: Skin is warm and dry.     Coloration: Skin is not jaundiced or pale.     Findings: Bruising (Hands and arms as  well as legs) present. No erythema.  Neurological:     General: No focal deficit present.     Mental Status: She is alert and oriented to person, place, and time.     Motor: No weakness.  Psychiatric:        Mood and Affect: Mood normal.        Behavior: Behavior normal.        Thought Content: Thought content normal.        Judgment: Judgment normal.     Comments: A little feisty today because she was frustrated about waiting.     Adult ECG Report Not checked.  Recent  Labs: Reviewed.  Excellent.  Lab Results  Component Value Date   CHOL 113 10/13/2022   HDL 42 10/13/2022   LDLCALC 50 10/13/2022   TRIG 103 10/13/2022   CHOLHDL 2.7 10/13/2022   Lab Results  Component Value Date   CREATININE 1.30 (H) 09/02/2022   BUN 14 09/02/2022   NA 147 (H) 09/02/2022   K 4.1 09/02/2022   CL 111 (H) 09/02/2022   CO2 21 09/02/2022   Lab Results  Component Value Date   CREATININE 1.30 (H) 09/02/2022   CREATININE 1.36 (H) 07/03/2022   CREATININE 1.54 (H) 03/29/2022       Latest Ref Rng & Units 09/02/2022    3:54 PM 07/03/2022    4:24 PM 03/29/2022    2:07 AM  CBC  WBC 3.4 - 10.8 x10E3/uL 6.4  6.6  6.4   Hemoglobin 11.1 - 15.9 g/dL 65.7  84.6  96.2   Hematocrit 34.0 - 46.6 % 40.7  41.6  35.2   Platelets 150 - 450 x10E3/uL 158  184  134     Lab Results  Component Value Date   HGBA1C 6.1 (H) 10/13/2022  03/27/2022, A1c was 5.9.  Lab Results  Component Value Date   TSH 4.660 (H) 07/03/2022  Free T4   1.27 -> normal  ================================================== I spent a total of 22  minutes with the patient spent in direct patient consultation.  Additional time spent with chart review  / charting (studies, outside notes, etc): 13 min;  Total Time: 35 min  Current medicines are reviewed at length with the patient today.  (+/- concerns) N/A  Notice: This dictation was prepared with Dragon dictation along with smart phrase technology. Any transcriptional errors that result from this process are unintentional and may not be corrected upon review.  Studies Ordered:  No orders of the defined types were placed in this encounter.  Meds ordered this encounter  Medications   prasugrel (EFFIENT) 10 MG TABS tablet    Sig: Take 1 tablet (10 mg total) by mouth daily.    Dispense:  90 tablet    Refill:  3    Stopping brilinta    Patient Instructions / Medication Changes & Studies & Tests Ordered   Patient Instructions  Medication Instructions:   Your physician recommends that you continue on your current medications as directed. Please refer to the Current Medication list given to you today.  *If you need a refill on your cardiac medications before your next appointment, please call your pharmacy*  Lab Work: -None ordered If you have labs (blood work) drawn today and your tests are completely normal, you will receive your results only by: MyChart Message (if you have MyChart) OR A paper copy in the mail If you have any lab test that is abnormal or we need to change your treatment,  we will call you to review the results.  Testing/Procedures: -None ordered  Follow-Up: At [facility], you and your health needs are our priority.  As part of our continuing mission to provide you with exceptional heart care, we have created designated Provider Care Teams.  These Care Teams include your primary Cardiologist (physician) and Advanced Practice Providers (APPs -  Physician Assistants and Nurse Practitioners) who all work together to provide you with the care you need, when you need it.  We recommend signing up for the patient portal called "MyChart".  Sign up information is provided on this After Visit Summary.  MyChart is used to connect with patients for Virtual Visits (Telemedicine).  Patients are able to view lab/test results, encounter notes, upcoming appointments, etc.  Non-urgent messages can be sent to your provider as well.   To learn more about what you can do with MyChart, go to [email or web address].    Your next appointment:   5 month(s)  Provider:   Bryan Lemma, MD    Other Instructions -Make sure to stay hydrated       Marykay Lex, MD, MS Madison Calhoun, M.D., M.S. Interventional Cardiologist  [facility]   [address]; [address] [place], [place]  [address] [phone]           Fax [phone]    Thank you for choosing [facility] in  [place]!!

## 2022-11-13 ENCOUNTER — Encounter: Payer: Self-pay | Admitting: Cardiology

## 2022-11-13 ENCOUNTER — Ambulatory Visit: Payer: Medicare Other | Attending: Cardiology | Admitting: Cardiology

## 2022-11-13 VITALS — BP 142/78 | HR 80 | Ht 64.0 in | Wt 229.0 lb

## 2022-11-13 DIAGNOSIS — E785 Hyperlipidemia, unspecified: Secondary | ICD-10-CM | POA: Insufficient documentation

## 2022-11-13 DIAGNOSIS — I2510 Atherosclerotic heart disease of native coronary artery without angina pectoris: Secondary | ICD-10-CM | POA: Insufficient documentation

## 2022-11-13 DIAGNOSIS — I255 Ischemic cardiomyopathy: Secondary | ICD-10-CM | POA: Insufficient documentation

## 2022-11-13 DIAGNOSIS — I252 Old myocardial infarction: Secondary | ICD-10-CM | POA: Diagnosis not present

## 2022-11-13 DIAGNOSIS — N1832 Chronic kidney disease, stage 3b: Secondary | ICD-10-CM

## 2022-11-13 DIAGNOSIS — R600 Localized edema: Secondary | ICD-10-CM | POA: Diagnosis not present

## 2022-11-13 DIAGNOSIS — R7303 Prediabetes: Secondary | ICD-10-CM | POA: Insufficient documentation

## 2022-11-13 DIAGNOSIS — Z9861 Coronary angioplasty status: Secondary | ICD-10-CM

## 2022-11-13 MED ORDER — PRASUGREL HCL 10 MG PO TABS: 10.0000 mg | ORAL_TABLET | Freq: Every day | ORAL | 3 refills | Status: DC

## 2022-11-13 NOTE — Patient Instructions (Signed)
Medication Instructions:  Your physician recommends that you continue on your current medications as directed. Please refer to the Current Medication list given to you today.  *If you need a refill on your cardiac medications before your next appointment, please call your pharmacy*  Lab Work: -None ordered If you have labs (blood work) drawn today and your tests are completely normal, you will receive your results only by: MyChart Message (if you have MyChart) OR A paper copy in the mail If you have any lab test that is abnormal or we need to change your treatment, we will call you to review the results.  Testing/Procedures: -None ordered  Follow-Up: At [facility], you and your health needs are our priority.  As part of our continuing mission to provide you with exceptional heart care, we have created designated Provider Care Teams.  These Care Teams include your primary Cardiologist (physician) and Advanced Practice Providers (APPs -  Physician Assistants and Nurse Practitioners) who all work together to provide you with the care you need, when you need it.  We recommend signing up for the patient portal called "MyChart".  Sign up information is provided on this After Visit Summary.  MyChart is used to connect with patients for Virtual Visits (Telemedicine).  Patients are able to view lab/test results, encounter notes, upcoming appointments, etc.  Non-urgent messages can be sent to your provider as well.   To learn more about what you can do with MyChart, go to [email or web address].    Your next appointment:   5 month(s)  Provider:   Bryan Lemma, MD    Other Instructions -Make sure to stay hydrated

## 2022-11-23 NOTE — Assessment & Plan Note (Signed)
8 months post PCI to LAD and LCx.  No further anginal symptoms.  Exertional dyspnea is really no change, related to body habitus and deconditioning.  Otherwise asymptomatic.  BP is up little bit today because she was frustrated.  Otherwise has been pretty well-controlled at home.  Plan: For now, continue with carvedilol at current dosing 3.125 mg in the morning and 6.25 mg in the evening but low threshold to reduce to 3.125 mg twice daily. If we do reduce to 3.125 mg twice daily would then add losartan 25 mg daily for additional blood pressure control. Continue DAPT ASA/Effient through October 2024-we will see her back in follow-up-November at which time we can discontinue Effient to go to Plavix 75 mg monotherapy for 1 year, and stopping aspirin. I told her that if she has bad bruising she can hold aspirin for couple days.

## 2022-11-23 NOTE — Assessment & Plan Note (Signed)
Patient is very sedentary.  I did recommend that she is to try to work on her exercise.  Hopefully with her cruises, she will get be more active.  She currently just does activity in the backyard.  I encouraged her to try to get some more active walking as opposed to just "messing around".

## 2022-11-23 NOTE — Assessment & Plan Note (Signed)
Follow-up A1c is little higher than it was in October.  Still need to monitor closely.  Talked about healthy diet.

## 2022-11-23 NOTE — Assessment & Plan Note (Addendum)
With addition of Zetia, her labs now look outstanding.  LDL of 50.  Well within target range on combination 40 mg atorvastatin plus Zetia.  Continue current dosing.  Will be due for follow-up labs in November.

## 2022-11-23 NOTE — Assessment & Plan Note (Signed)
EF estimated 45%.  Low threshold to add ARB.  She is not having any active heart failure symptoms.  For now we will hold off on ARB and spironolactone but neck step would be to add low-dose ARB +/- reduce carvedilol dose if necessary.

## 2022-11-23 NOTE — Assessment & Plan Note (Addendum)
I agree that she has some mild lower EXTR edema I do not know if it is related to heart failure.  Probably more related to venous stasis.  Agree with compression stockings and foot elevation. Would probably avoid diuretic unless she has PND or orthopnea symptoms.  Although I agree with avoiding excess salt intake, I do think she needs to stay adequately hydrated due to her intermittent dizzy spells.

## 2023-01-03 ENCOUNTER — Other Ambulatory Visit: Payer: Self-pay | Admitting: Cardiology

## 2023-01-06 ENCOUNTER — Telehealth: Payer: Self-pay | Admitting: Cardiology

## 2023-01-06 ENCOUNTER — Other Ambulatory Visit: Payer: Self-pay

## 2023-01-06 MED ORDER — EZETIMIBE 10 MG PO TABS: 10.0000 mg | ORAL_TABLET | Freq: Every day | ORAL | 3 refills | Status: DC

## 2023-01-06 NOTE — Telephone Encounter (Signed)
*  STAT* If patient is at the pharmacy, call can be transferred to refill team.   1. Which medications need to be refilled? (please list name of each medication and dose if known)   ezetimibe (ZETIA) 10 MG tablet   2. Would you like to learn more about the convenience, safety, & potential cost savings by using the [facility]?    3. Are you open to using the [facility] (Type [facility]. ).  4. Which pharmacy/location (including street and city if local pharmacy) is medication to be sent to?  [facility] [address] [place] - [address]   5. Do they need a 30 day or 90 day supply?   90 day  Patient stated she has 9 tablets left.

## 2023-04-16 ENCOUNTER — Encounter: Payer: Self-pay | Admitting: Cardiology

## 2023-04-16 ENCOUNTER — Ambulatory Visit: Payer: Medicare Other | Attending: Cardiology | Admitting: Cardiology

## 2023-04-16 VITALS — BP 120/70 | HR 70 | Ht 64.0 in | Wt 232.0 lb

## 2023-04-16 DIAGNOSIS — I2510 Atherosclerotic heart disease of native coronary artery without angina pectoris: Secondary | ICD-10-CM

## 2023-04-16 DIAGNOSIS — E785 Hyperlipidemia, unspecified: Secondary | ICD-10-CM

## 2023-04-16 DIAGNOSIS — I252 Old myocardial infarction: Secondary | ICD-10-CM | POA: Diagnosis not present

## 2023-04-16 DIAGNOSIS — R7303 Prediabetes: Secondary | ICD-10-CM | POA: Diagnosis not present

## 2023-04-16 DIAGNOSIS — I214 Non-ST elevation (NSTEMI) myocardial infarction: Secondary | ICD-10-CM | POA: Diagnosis not present

## 2023-04-16 DIAGNOSIS — Z9861 Coronary angioplasty status: Secondary | ICD-10-CM | POA: Insufficient documentation

## 2023-04-16 DIAGNOSIS — I255 Ischemic cardiomyopathy: Secondary | ICD-10-CM | POA: Diagnosis not present

## 2023-04-16 MED ORDER — CLOPIDOGREL BISULFATE 75 MG PO TABS: 75.0000 mg | ORAL_TABLET | Freq: Every day | ORAL | 3 refills | Status: AC

## 2023-04-16 NOTE — Assessment & Plan Note (Signed)
No angina symptoms. Currently on aspirin and Effient with bruising side effects. Discussed switching to Plavix for less bruising and discontinuing aspirin. -Discontinue Effient after current prescription is finished. -Start Plavix after Effient is finished. For the first two days, take two tablets, then one tablet daily thereafter. -Discontinue aspirin.

## 2023-04-16 NOTE — Assessment & Plan Note (Signed)
EF estimated 45%, with no active heart failure symptoms.  Seems euvolemic.  No PND orthopnea.  No edema. Plan: Continue current dose of carvedilol with a lower dose in the morning.  If BP were to increase steadily, would consider ARB, but seems euvolemic now.  No need for diuretic.

## 2023-04-16 NOTE — Assessment & Plan Note (Signed)
A1c 6.1.  Recommend discussing GLP-1 agonist with PCP as this could also potentially be used for cardiovascular benefit as well as weight loss.

## 2023-04-16 NOTE — Patient Instructions (Addendum)
Medication Instructions:  Your physician has recommended you make the following change in your medication:  Once you finish the current bottle of prasugrel (EFFIENT) 10 MG tablet you will discontinue it. The you will start clopidogrel (PLAVIX) 75 MG tablet - Take 1 tablet (75 mg total) by mouth daily   STOP: aspirin EC 81 MG tablet  *If you need a refill on your cardiac medications before your next appointment, please call your pharmacy*  Lab Work: - None ordered  Testing/Procedures: - None ordered  Follow-Up: At [facility], you and your health needs are our priority.  As part of our continuing mission to provide you with exceptional heart care, we have created designated Provider Care Teams.  These Care Teams include your primary Cardiologist (physician) and Advanced Practice Providers (APPs -  Physician Assistants and Nurse Practitioners) who all work together to provide you with the care you need, when you need it.  Your next appointment:   6 month(s)  Provider:   Bryan Lemma, MD    Other Instructions - None

## 2023-04-16 NOTE — Assessment & Plan Note (Signed)
Unfortunately, limited by back and knee pain which goes along with her obesity.  This keeps her from being active. The patient understands the need to lose weight with diet and exercise. We have discussed specific strategies for this.

## 2023-04-16 NOTE — Assessment & Plan Note (Signed)
Just about 1 year out.  Doing well now no recurrent angina or heart failure symptoms.

## 2023-04-16 NOTE — Assessment & Plan Note (Signed)
Labs checked in May were very reassuring with an LDL of 50. - For now we will continue 40 mg and atorvastatin and continue to follow labs with PCP.

## 2023-04-16 NOTE — Progress Notes (Signed)
Cardiology Office Note:  .   Date:  04/16/2023  ID:  Madison Calhoun, DOB August 25, 1950, MRN [id number] PCP: Tollie Eth, NP  [facility] Providers Cardiologist:  Bryan Lemma, MD     Chief Complaint  Patient presents with   5 month follow up     "Doing well." Medications reviewed by the patient verbally.    Coronary Artery Disease    No active angina    Patient Profile: .     Madison Calhoun is a morbidly obese/very sedentary  72 y.o. 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  Non-STEMI 03/27/2022: Angina was pain between shoulder blades and up the neck to the nape of the head. Cath = 2 V DES PCI 03/28/2022: ost LAD 50%, p-mLAD 90% (DES PCI), pLCX 90% (DES PCI), ~ nl RCA Converted to Effient because of breathing issues. = Uninterrupted DAPT until October '24 Plan to convert to Plavix SAPT for year #2.  Carvedilol reduced to 3.125 mg every morning and 6. 25 mg every afternoon Did not go to cardiac rehab because it was 3 days a week and required too much activity Lipids well controlled with statin + Zetia Echo 03/28/2022: ~ EF ~45%, distal Anterior, inferior & apical HK/AK. Marland Kitchen Near syncope 07/03/2022: frequent episodes of near syncope with standing in place for prolonged period time., Lower extremity edema: => Recommended support stockings.   PAIGE MONARREZ was last seen on 11/13/22 as a 22-month follow-up.  Feeling better having reduced the daytime dose of carvedilol.  Less fatigue.  No chest pain or pressure and improved fatigue and tiredness.  No heart failure symptoms.  Excited about the upcoming trip to [place] preceded by a trip to the [place].  Definitely noted exertional dyspnea and exercise intolerance as well as being chronically limited by deconditioning, obesity back in leg/knee pain.  Also bruising.  No med changes  made.  Subjective  Discussed the use of AI scribe software for clinical note transcription with the patient, who gave verbal consent to proceed.  History of Present Illness   The patient, with a history of cardiac stent placement, presented for a routine follow-up.  She just returned from a ~2 week cruise to [place] - 4 days to & from at sea & 4 days in [place].  She rented a scooter to help her get around and keep up with her daughter, but did walk more than usual - limited by back & knee pain.    She reported no chest pain or pressure, and no symptoms suggestive of heart failure or arrhythmias. She denied experiencing any dizziness or fainting spells. The patient's blood pressure has been well-controlled, with occasional headaches managed with Tylenol. She reported no bleeding issues, such as blood in stool, dark tarry stools, or nosebleeds.  The patient's mobility is primarily limited by musculoskeletal issues, including back pain and knee discomfort, rather than cardiac symptoms. She reported frequent urination at night but denied any nocturnal dyspnea. The patient noted that her right leg appears more swollen than the left, a chronic issue she attributes to a past foot injury.  The patient's medication regimen includes aspirin and Effient, the latter of which was recently filled. She reported occasional sharp pains in the upper chest area, which occurred during a recent cruise trip but resolved upon  return. The patient sleeps on two to three pillows, not due to breathing difficulties, but personal comfort.  The patient's activity level is limited by back pain, necessitating sitting while cooking. She reported a history of a broken foot, which she believes may have contributed to ongoing discomfort and swelling in the right leg. The patient's medication adherence was affected during her recent cruise due to the busy schedule, but she attempted to take her medications before leaving for activities  when possible.     Cardiovascular ROS: positive for - dyspnea on exertion and overall improved fatigue, but still present.  Deconditioning limiting exercise.  Diffuse bruising negative for - chest pain, edema, irregular heartbeat, orthopnea, palpitations, paroxysmal nocturnal dyspnea, rapid heart rate, shortness of breath, or lightheadedness, dizziness and wooziness; syncope or near syncope, TIA/CVA or amaurosis fugax, claudication; melena, hematochezia, hematuria or epistaxis.  ROS:  Review of Systems - Negative except symptoms noted above   Objective  Current Meds  Medication Sig   atorvastatin (LIPITOR) 40 MG tablet Take 1 tablet (40 mg total) by mouth every evening.   carvedilol (COREG) 6.25 MG tablet Take 0.5 tablet (3.125 mg) by mouth in the morning and 1 tablet (6.25 mg) by mouth in the evening   Cholecalciferol (VITAMIN D3) 1.25 MG (50000 UT) TABS Take 1 tablet by mouth once a week.   ezetimibe (ZETIA) 10 MG tablet Take 1 tablet (10 mg total) by mouth daily.   levothyroxine (SYNTHROID) 25 MCG tablet Take 1 tablet (25 mcg total) by mouth daily.   nitroGLYCERIN (NITROSTAT) 0.4 MG SL tablet Place 1 tablet (0.4 mg total) under the tongue every 5 (five) minutes x 3 doses as needed for chest pain.   prasugrel (EFFIENT) 10 MG TABS tablet Take 1 tablet (10 mg total) by mouth daily.   Aspirin 81 mg p.o. Take 1 tab daily   Studies Reviewed: Marland Kitchen   EKG Interpretation Date/Time:  Thursday April 16 2023 10:28:42 EST Ventricular Rate:  70 PR Interval:  186 QRS Duration:  68 QT Interval:  386 QTC Calculation: 416 R Axis:   -17  Text Interpretation: Normal sinus rhythm Nonspecific T wave abnormality When compared with ECG of 29-Mar-2022 05:45, Premature ventricular complexes are no longer Present Nonspecific T wave abnormality, improved in Inferior leads T wave inversion no longer evident in Lateral leads QT has shortened Confirmed by Bryan Lemma ([id number]) on 04/16/2023 10:33:51 AM    Lab  Results  Component Value Date   NA 147 (H) 09/02/2022   K 4.1 09/02/2022   CREATININE 1.30 (H) 09/02/2022   EGFR 43 (L) 09/02/2022   GLUCOSE 112 (H) 09/02/2022   Lab Results  Component Value Date   CHOL 113 10/13/2022   HDL 42 10/13/2022   LDLCALC 50 10/13/2022   TRIG 103 10/13/2022   CHOLHDL 2.7 10/13/2022   Lab Results  Component Value Date   HGBA1C 6.1 (H) 10/13/2022   ECHO:  1. Difficult acoustic windows. LVEF is depressed with hypokinesis/ akinesis of the distal anterior, distal inferior and apical walls. . Left ventricular ejection fraction, by estimation, is 45% . The LV was mildly dilated. Mild LVH. 2. RVH size & function are normal.  3. Normal MV. & Trivial  MR. 4. The aortic valve is normal in structure. Aortic valve regurgitation is not visualized. 5. The inferior vena cava is normal in size with greater than 50% respiratory variability, suggesting right atrial pressure of 3 mmHg.  Cardiac Cath-PCI 03/28/2022: (Non-STEMI): Ost LAD ~50% w/ severe prox-mid  LAD ~90% (DES PCI -Synergy XD 2.75 x 16 -> 3.0 mm); Prox LCx 90% (DES PCI - Synergy XD 3.5 x 12 -> 4.0 mm); mild /nonobstructive disease in RCA.  Normal LVEDP.   Risk Assessment/Calculations:            Physical Exam:   VS:  BP 120/70 (BP Location: Left Arm, Patient Position: Sitting, Cuff Size: Large)   Pulse 70   Ht 5\' 4"  (1.626 m)   Wt 232 lb (105.2 kg)   SpO2 96%   BMI 39.82 kg/m    Wt Readings from Last 3 Encounters:  04/16/23 232 lb (105.2 kg)  11/13/22 229 lb (103.9 kg)  09/02/22 228 lb 3.2 oz (103.5 kg)    GEN: Well nourished, well developed in no acute distress; morbidly obese but well-groomed. NECK: No JVD; No carotid bruits CARDIAC: RRR, (distant, but) normal S1, S2;  no murmurs, rubs, gallops RESPIRATORY:  Clear to auscultation without rales, wheezing or rhonchi ; nonlabored, good air movement. ABDOMEN: Soft, non-tender, non-distended EXTREMITIES:  No edema; No deformity     ASSESSMENT AND  PLAN: .    Problem List Items Addressed This Visit       Cardiology Problems   CAD S/P 2 Vessel DES PCI (prox-mid LAD & prox LCx) - Primary (Chronic)    No angina symptoms. Currently on aspirin and Effient with bruising side effects. Discussed switching to Plavix for less bruising and discontinuing aspirin. -Discontinue Effient after current prescription is finished. -Start Plavix after Effient is finished. For the first two days, take two tablets, then one tablet daily thereafter. -Discontinue aspirin.      Relevant Orders   EKG 12-Lead (Completed)   Hyperlipidemia LDL goal <70 (Chronic)    Labs checked in May were very reassuring with an LDL of 50. - For now we will continue 40 mg and atorvastatin and continue to follow labs with PCP.      Ischemic cardiomyopathy (Chronic)    EF estimated 45%, with no active heart failure symptoms.  Seems euvolemic.  No PND orthopnea.  No edema. Plan: Continue current dose of carvedilol with a lower dose in the morning.  If BP were to increase steadily, would consider ARB, but seems euvolemic now.  No need for diuretic.       Relevant Orders   EKG 12-Lead (Completed)     Other   History of non-ST elevation myocardial infarction (NSTEMI) (Chronic)    Just about 1 year out.  Doing well now no recurrent angina or heart failure symptoms.      Relevant Orders   EKG 12-Lead (Completed)   Morbid obesity (HCC) (Chronic)    Unfortunately, limited by back and knee pain which goes along with her obesity.  This keeps her from being active. The patient understands the need to lose weight with diet and exercise. We have discussed specific strategies for this.      Pre-diabetes (Chronic)    A1c 6.1.  Recommend discussing GLP-1 agonist with PCP as this could also potentially be used for cardiovascular benefit as well as weight loss.      Other Visit Diagnoses     NSTEMI (non-ST elevated myocardial infarction) (HCC)       Relevant Orders   EKG  12-Lead (Completed)       Musculoskeletal Limitations Limited mobility due to back, knee, and foot pain. Discussed obtaining a disability placard. -Contact primary care provider for disability placard form.  Follow-Up: Return in about 6 months (around 10/14/2023). Follow-up in 5-6 months. If well, annual visits thereafter.   Total time spent: 23 min spent with patient + 18 min spent charting = 41 min    Signed, Marykay Lex, MD, MS Bryan Lemma, M.D., M.S. Interventional Cardiologist  [facility]  Pager # [phone] Phone # [phone] [address] [place], [place] [address]

## 2023-06-11 ENCOUNTER — Telehealth: Payer: Self-pay | Admitting: Cardiology

## 2023-06-11 MED ORDER — CARVEDILOL 6.25 MG PO TABS: ORAL_TABLET | ORAL | 3 refills | Status: AC

## 2023-06-11 NOTE — Telephone Encounter (Signed)
 Pt c/o medication issue:  1. Name of Medication: carvedilol  (COREG ) 6.25 MG tablet   2. How are you currently taking this medication (dosage and times per day)? As prescribed   3. Are you having a reaction (difficulty breathing--STAT)? No   4. What is your medication issue? Patient is calling stating when she saw Dr. Anner in June he had wanted to change this medication, but they didn't due to her already having the medication she wanted to use up. She is wanting to know if he would like to change it now due to her being on her last two tablets and need a refill. Please advise.

## 2023-06-11 NOTE — Telephone Encounter (Signed)
 Called and spoke to patient. Name and DOB verified. Refill sent to [facility] pharmacy per request for Carvedilol  6.25 mg. Patient had been taking 6.25 mg BID. Advised patient instructions states to take 3.125 mg in the morning and 6.25 in the evening. Patient verbalized understanding and agree.

## 2023-06-12 ENCOUNTER — Telehealth: Payer: Self-pay | Admitting: Cardiology

## 2023-06-12 NOTE — Telephone Encounter (Signed)
*  STAT* If patient is at the pharmacy, call can be transferred to refill team.   1. Which medications need to be refilled? (please list name of each medication and dose if known) carvedilol  (COREG ) 6.25 MG tablet ;  ezetimibe  (ZETIA ) 10 MG tablet   2. Would you like to learn more about the convenience, safety, & potential cost savings by using the [facility]?     3. Are you open to using the [facility] (Type [facility].  ).   4. Which pharmacy/location (including street and city if local pharmacy) is medication to be sent to? [facility] [address], [place] - [address]    5. Do they need a 30 day or 90 day supply? 90

## 2023-06-30 ENCOUNTER — Ambulatory Visit: Payer: Medicare Other

## 2023-10-05 ENCOUNTER — Telehealth: Payer: Self-pay

## 2023-10-05 ENCOUNTER — Other Ambulatory Visit: Payer: Self-pay

## 2023-10-05 DIAGNOSIS — E785 Hyperlipidemia, unspecified: Secondary | ICD-10-CM

## 2023-10-05 MED ORDER — ATORVASTATIN CALCIUM 40 MG PO TABS: 40.0000 mg | ORAL_TABLET | Freq: Every evening | ORAL | 0 refills | Status: DC

## 2023-10-05 NOTE — Telephone Encounter (Signed)
 Refill request  Atorvastatin  40 mg Last filled 09/02/2022

## 2023-10-05 NOTE — Telephone Encounter (Signed)
 Scheduled pt for June 20th for med check 11am

## 2023-10-08 ENCOUNTER — Other Ambulatory Visit: Payer: Self-pay

## 2023-10-08 ENCOUNTER — Telehealth: Payer: Self-pay | Admitting: Nurse Practitioner

## 2023-10-08 DIAGNOSIS — E039 Hypothyroidism, unspecified: Secondary | ICD-10-CM

## 2023-10-08 MED ORDER — LEVOTHYROXINE SODIUM 25 MCG PO TABS: 25.0000 ug | ORAL_TABLET | Freq: Every day | ORAL | 0 refills | Status: DC

## 2023-10-08 NOTE — Telephone Encounter (Signed)
 Levothyroxine  25 mcg # 90 Last filled 08/22/23

## 2023-10-22 ENCOUNTER — Encounter: Payer: Self-pay | Admitting: Cardiology

## 2023-10-22 ENCOUNTER — Ambulatory Visit: Attending: Cardiology | Admitting: Cardiology

## 2023-10-22 VITALS — BP 112/84 | HR 79 | Ht 64.0 in | Wt 239.8 lb

## 2023-10-22 DIAGNOSIS — I252 Old myocardial infarction: Secondary | ICD-10-CM | POA: Insufficient documentation

## 2023-10-22 DIAGNOSIS — I2510 Atherosclerotic heart disease of native coronary artery without angina pectoris: Secondary | ICD-10-CM | POA: Diagnosis not present

## 2023-10-22 DIAGNOSIS — R600 Localized edema: Secondary | ICD-10-CM | POA: Insufficient documentation

## 2023-10-22 DIAGNOSIS — E785 Hyperlipidemia, unspecified: Secondary | ICD-10-CM | POA: Diagnosis not present

## 2023-10-22 DIAGNOSIS — N1832 Chronic kidney disease, stage 3b: Secondary | ICD-10-CM | POA: Diagnosis not present

## 2023-10-22 DIAGNOSIS — I255 Ischemic cardiomyopathy: Secondary | ICD-10-CM | POA: Insufficient documentation

## 2023-10-22 DIAGNOSIS — Z9861 Coronary angioplasty status: Secondary | ICD-10-CM | POA: Insufficient documentation

## 2023-10-22 NOTE — Progress Notes (Signed)
 Cardiology Office Note:  .   Date:  10/22/2023  ID:  Madison Calhoun, DOB Nov 06, 1950, MRN [id number] PCP: Annella Kief, NP  Madison Calhoun Cardiologist:  Randene Bustard, MD     Chief Complaint  Patient presents with   Follow-up    Doing well.  No complaints   Coronary Artery Disease    No active angina    Patient Profile: .     Madison Calhoun is a morbidly obese / sedentary 73 y.o. female with a PMH notable for severe 2V CAD (non-STEMI with two-vessel PCI), mild ICM (EF 45%) HLD, CKD 3, and pre-DM2 (summarized below) who presents here for ~6 month f/u.  She was referred at the request of Early, Adriane Albe, NP.  Non-STEMI 03/27/2022: Angina was pain between shoulder blades and up the neck to the nape of the head. Cath = 2 V DES PCI 03/28/2022: ost LAD 50%, p-mLAD 90% (DES PCI), pLCX 90% (DES PCI), ~ nl RCA Converted to Effient  because of breathing issues. = Uninterrupted DAPT until October '24 Plan to convert to Plavix  SAPT for year #2.  Carvedilol  reduced to 3.125 mg every morning and 6. 25 mg every afternoon Did not go to cardiac rehab because it was 3 days a week and required too much activity Lipids well controlled with statin + Zetia  Echo 03/28/2022: ~ EF ~45%, distal Anterior, inferior & apical HK/AK. Madison Calhoun Near syncope 07/03/2022: frequent episodes of near syncope with standing in place for prolonged period time., Lower extremity edema: => Recommended support stockings.    [name] was last seen on 04/16/2023: She had just returned from a ~2 week cruise to [place]  - 4 days to & from [place] sea & 4 days [place] .  She rented a scooter to help her get around and keep up with her daughter, but did walk more than usual - limited by back & knee pain.  She reported no chest pain or pressure, and no symptoms suggestive of heart failure or arrhythmias. She denied experiencing any dizziness or fainting spells. The patient's blood pressure has been well-controlled, with  occasional headaches managed with Tylenol . She reported no bleeding issues, such as blood in stool, dark tarry stools, or nosebleeds.   The patient's mobility is primarily limited by musculoskeletal issues, including back pain and knee discomfort, rather than cardiac symptoms. She reported frequent urination at night but denied any nocturnal dyspnea. The patient noted that her right leg appears more swollen than the left, a chronic issue she attributes to a past foot injury. => Plan was to complete Effient  & convert to Plavix  75 mg daily, d/c ASA  Subjective  Discussed the use of AI scribe software for clinical note transcription with the patient, who gave verbal consent to proceed.  History of Present Illness Madison Calhoun is a 73 year old female with coronary artery disease who presents with foot pain and swelling.  She has been experiencing foot pain and swelling since a recent cruise three weeks ago. The pain began in her big toe and subsequently spread to other areas of her foot. It is intermittent, with occasional flare-ups. The swelling occurred once and resolved without recurrence. No shortness of breath, chest pain, or heart palpitations during these episodes.  She has a history of coronary artery disease with stent placement and is currently taking Plavix , though she is unsure of its purpose. She is not taking Effient . Her carvedilol  regimen includes half a tablet in the morning  and a full tablet in the evening. She notes confusion regarding her medication supply, citing a discrepancy in the quantity provided by the pharmacy.  No chest pain, pressure, or tightness. She feels tired when walking from the parking lot to the door, attributing it to foot pain. No episodes of heart racing, skipping, or flipping. No waking up short of breath or experiencing any pass-out spells.  She recalls a past episode of a sensation in her chest like 'a machine moving around,' which resolved after a deep  breath. No current issues with this sensation.  She was active on her cruise, using a scooter for mobility due to the ship's size and her foot pain. She walked around the ship to get food and engaged in some walking at home, such as letting her dogs out and moving around the house. She mentions a past issue with a nerve in her leg that has since resolved.  She drinks water regularly but did not drink much on the ship, except for the recent cruise where she drank bottled water. She denies dietary salt intake, using pink Himalayan salt instead. She attributes the swelling to possibly consuming saltier foods on the ship.  ROS:  Review of Systems - she continues to be somewhat limited in mobility and therefore is extremely sedentary.  She is currently being bothered by right foot pain that limits her walking.  However she did just go on a cruise and used a scooter for most the time there.    Objective   Medications - Plavix  75 mg daily - Lipitor 40 mg daily;- Zetia  10 mg daily - Coreg  6.25 mg tablet: 1/2 tab qAM &, full Tab qPM - Synthroid    Studies Reviewed: Madison Calhoun   EKG Interpretation Date/Time:  Thursday Oct 22 2023 10:08:38 EDT Ventricular Rate:  79 PR Interval:  178 QRS Duration:  68 QT Interval:  362 QTC Calculation: 415 R Axis:   -22  Text Interpretation: Normal sinus rhythm Normal ECG When compared with ECG of 16-Apr-2023 10:28, No significant change was found Confirmed by Randene Bustard ([id number]) on 10/22/2023 10:19:00 AM    Lab Results  Component Value Date   CHOL 113 10/13/2022   HDL 42 10/13/2022   LDLCALC 50 10/13/2022   TRIG 103 10/13/2022   CHOLHDL 2.7 10/13/2022   Lab Results  Component Value Date   NA 147 (H) 09/02/2022   K 4.1 09/02/2022   CREATININE 1.30 (H) 09/02/2022   EGFR 43 (L) 09/02/2022   GLUCOSE 112 (H) 09/02/2022   Cardiac Cath-PCI 03/28/2022: (Non-STEMI): Ost LAD ~50% w/ severe prox-mid LAD ~90% (DES PCI -Synergy XD 2.75 x 16 -> 3.0 mm); Prox LCx 90%  (DES PCI - Synergy XD 3.5 x 12 -> 4.0 mm); mild /nonobstructive disease in RCA.  Normal LVEDP.                ECHO:(03/2022): 1. Difficult acoustic windows. LVEF is depressed with hypokinesis/ akinesis of the distal anterior, distal inferior and apical walls. . Left ventricular ejection fraction, by estimation, is 45% . The LV was mildly dilated. Mild LVH. 2. RVH size & function are normal.  3. Normal MV. & Trivial  MR. 4. The aortic valve is normal in structure. Aortic valve regurgitation is not visualized. 5. The inferior vena cava is normal in size with greater than 50% respiratory variability, suggesting right atrial pressure of 3 mmHg.  Risk Assessment/Calculations:         Physical Exam:   VS:  BP  112/84   Pulse 79   Ht 5\' 4"  (1.626 m)   Wt 239 lb 12.8 oz (108.8 kg)   SpO2 96%   BMI 41.16 kg/m    Wt Readings from Last 3 Encounters:  10/22/23 239 lb 12.8 oz (108.8 kg)  04/16/23 232 lb (105.2 kg)  11/13/22 229 lb (103.9 kg)    GEN: Well nourished, well groomed; in no acute distress; morbidly obese.  NECK: No JVD; No carotid bruits CARDIAC: Normal S1, S2; RRR, no murmurs, rubs, gallops RESPIRATORY:  Clear to auscultation without rales, wheezing or rhonchi ; nonlabored, good air movement. ABDOMEN: Soft, non-tender, non-distended EXTREMITIES: Trivial edema; No deformity  Walks with a limp    ASSESSMENT AND PLAN: .    Problem List Items Addressed This Visit       Cardiology Problems   CAD S/P 2 Vessel DES PCI (prox-mid LAD & prox LCx) - Primary (Chronic)   Coronary artery disease with stent Coronary artery disease managed with Plavix  75 mg daily monotherapy is back in for stent patency.  No symptoms reported. Plavix  chosen over Effient  for maintenance. - Continue Plavix  75 mg daily monotherapy - Continue carvedilol  at current dosing which is 3.125 mg AM & 6.25 mg PM - Continue combination Lipitor 40 mg daily & Zetia  10 mg daily for lipid management - Encourage regular  physical activity.      Hyperlipidemia with target low density lipoprotein (LDL) cholesterol less than 55 mg/dL (Chronic)   On combination Lipitor 40 mg daily and Zetia  10 daily.  Labs to be checked end of this month early next month.  Most recent lipids from May 2024 showed excellent control with LDL of 50. -Continue current dose of Lipitor and Zetia       Ischemic cardiomyopathy (Chronic)   Mildly reduced EF of 45% with no active heart failure symptoms.  Euvolemic on exam.  No PND orthopnea with trivial edema.  Exertional dyspnea is because of deconditioning.  - Continue carvedilol  at current dosing. - BP not adequate to have ARB or spironolactone. - Has not required diuretic, but could consider as needed Lasix.      Relevant Orders   EKG 12-Lead (Completed)     Other   CKD (chronic kidney disease) stage 3, GFR 30-59 ml/min (HCC) (Chronic)   Follow-up PCP.  To be due for labs to be checked soon.  Last creatinine noted was 1.3.      History of non-ST elevation myocardial infarction (NSTEMI) (Chronic)   Lower extremity edema (Chronic)   She has had occasional episodes of edema but not routinely.  If this becomes an issue, would probably consider low-dose Lasix as a PRN dosing.  Otherwise recommend foot elevation and support stockings.      Morbid obesity (HCC) (Chronic)   Discussed need for try to see more active and work on weight loss. Can discuss with PCP potential of consideration for GLP 1 agonist.             Follow-Up: Return in about 7 months (around 05/23/2024) for [facility] office, Routine follow up with me.  Schedule follow-up appointment in December 2023. - Coordinate with primary care visits for alternating six-month check-ups.  Recording duration: 27 minutes Total time spent: spent with patient + 14 min spent charting = 41 min I spent 41 minutes in the care of Alpha L Amborn today including reviewing labs (1 min), reviewing studies (reviewing prior  studies-2 minutes), face to face time discussing treatment options (27), reviewing  records from previous visits  (30 minutes), 8 minutes dictating, and documenting in the encounter.     Signed, Arleen Lacer, MD, MS Randene Bustard, M.D., M.S. Interventional Chartered certified accountant  Pager # [phone]

## 2023-10-22 NOTE — Assessment & Plan Note (Signed)
 She has had occasional episodes of edema but not routinely.  If this becomes an issue, would probably consider low-dose Lasix as a PRN dosing.  Otherwise recommend foot elevation and support stockings.

## 2023-10-22 NOTE — Assessment & Plan Note (Signed)
 On combination Lipitor 40 mg daily and Zetia  10 daily.  Labs to be checked end of this month early next month.  Most recent lipids from May 2024 showed excellent control with LDL of 50. -Continue current dose of Lipitor and Zetia 

## 2023-10-22 NOTE — Assessment & Plan Note (Signed)
 Follow-up PCP.  To be due for labs to be checked soon.  Last creatinine noted was 1.3.

## 2023-10-22 NOTE — Assessment & Plan Note (Signed)
 Mildly reduced EF of 45% with no active heart failure symptoms.  Euvolemic on exam.  No PND orthopnea with trivial edema.  Exertional dyspnea is because of deconditioning.  - Continue carvedilol  at current dosing. - BP not adequate to have ARB or spironolactone. - Has not required diuretic, but could consider as needed Lasix.

## 2023-10-22 NOTE — Patient Instructions (Signed)
 Medication Instructions:  No changes at this time.   *If you need a refill on your cardiac medications before your next appointment, please call your pharmacy*  Lab Work: None  If you have labs (blood work) drawn today and your tests are completely normal, you will receive your results only by: MyChart Message (if you have MyChart) OR A paper copy in the mail If you have any lab test that is abnormal or we need to change your treatment, we will call you to review the results.  Testing/Procedures: None  Follow-Up: At [facility], you and your health needs are our priority.  As part of our continuing mission to provide you with exceptional heart care, our providers are all part of one team.  This team includes your primary Cardiologist (physician) and Advanced Practice Providers or APPs (Physician Assistants and Nurse Practitioners) who all work together to provide you with the care you need, when you need it.  Your next appointment:   7 month(s)  Provider:   Randene Bustard, MD

## 2023-10-22 NOTE — Assessment & Plan Note (Signed)
 Discussed need for try to see more active and work on weight loss. Can discuss with PCP potential of consideration for GLP 1 agonist.

## 2023-10-22 NOTE — Assessment & Plan Note (Signed)
 Coronary artery disease with stent Coronary artery disease managed with Plavix  75 mg daily monotherapy is back in for stent patency.  No symptoms reported. Plavix  chosen over Effient  for maintenance. - Continue Plavix  75 mg daily monotherapy - Continue carvedilol  at current dosing which is 3.125 mg AM & 6.25 mg PM - Continue combination Lipitor 40 mg daily & Zetia  10 mg daily for lipid management - Encourage regular physical activity.

## 2023-11-27 ENCOUNTER — Encounter: Payer: Self-pay | Admitting: Nurse Practitioner

## 2023-11-27 ENCOUNTER — Ambulatory Visit: Admitting: Nurse Practitioner

## 2023-11-27 VITALS — BP 132/84 | HR 78

## 2023-11-27 DIAGNOSIS — R600 Localized edema: Secondary | ICD-10-CM

## 2023-11-27 DIAGNOSIS — E039 Hypothyroidism, unspecified: Secondary | ICD-10-CM

## 2023-11-27 DIAGNOSIS — D500 Iron deficiency anemia secondary to blood loss (chronic): Secondary | ICD-10-CM

## 2023-11-27 DIAGNOSIS — E559 Vitamin D deficiency, unspecified: Secondary | ICD-10-CM | POA: Diagnosis not present

## 2023-11-27 DIAGNOSIS — E785 Hyperlipidemia, unspecified: Secondary | ICD-10-CM | POA: Diagnosis not present

## 2023-11-27 DIAGNOSIS — M5432 Sciatica, left side: Secondary | ICD-10-CM

## 2023-11-27 DIAGNOSIS — M5442 Lumbago with sciatica, left side: Secondary | ICD-10-CM | POA: Insufficient documentation

## 2023-11-27 DIAGNOSIS — N1832 Chronic kidney disease, stage 3b: Secondary | ICD-10-CM | POA: Diagnosis not present

## 2023-11-27 DIAGNOSIS — R7303 Prediabetes: Secondary | ICD-10-CM | POA: Diagnosis not present

## 2023-11-27 LAB — LIPID PANEL

## 2023-11-27 MED ORDER — EZETIMIBE 10 MG PO TABS: 10.0000 mg | ORAL_TABLET | Freq: Every day | ORAL | 3 refills | Status: AC

## 2023-11-27 MED ORDER — ATORVASTATIN CALCIUM 40 MG PO TABS: 40.0000 mg | ORAL_TABLET | Freq: Every evening | ORAL | 3 refills | Status: AC

## 2023-11-27 NOTE — Assessment & Plan Note (Signed)
 Chronic back pain with possible nerve involvement, presenting as a pinched nerve sensation on the left side. Symptoms include sharp, ice pick-like pain exacerbated by walking and slightly relieved by bending, persisting for over a month. Differential diagnosis includes degenerative spinal changes causing nerve compression and potential arthritis due to degeneration of spinal cushions. - Order lumbar spine x-ray to assess for degenerative changes - Provide stretching exercises to alleviate back pain - Refer to orthopedic specialist for further evaluation

## 2023-11-27 NOTE — Assessment & Plan Note (Signed)
 Chronic fatigue potentially related to thyroid  dysfunction. No other specific thyroid -related symptoms reported. Thyroid  function assessment is necessary to determine if medication adjustment is required. - Order thyroid  function tests - Hold thyroid  medication refill pending lab results

## 2023-11-27 NOTE — Assessment & Plan Note (Signed)
 No concerns today. Recommend elevation of legs to help reduce risk.

## 2023-11-27 NOTE — Assessment & Plan Note (Deleted)
 Chronic back pain with possible nerve involvement, presenting as a pinched nerve sensation on the left side. Symptoms include sharp, ice pick-like pain exacerbated by walking and slightly relieved by bending, persisting for over a month. Differential diagnosis includes degenerative spinal changes causing nerve compression and potential arthritis due to degeneration of spinal cushions. - Order lumbar spine x-ray to assess for degenerative changes - Provide stretching exercises to alleviate back pain - Refer to orthopedic specialist for further evaluation

## 2023-11-27 NOTE — Assessment & Plan Note (Signed)
 Repeat labs today

## 2023-11-27 NOTE — Assessment & Plan Note (Signed)
 Repeat A1c today. If diabetic level, consider GLP-1

## 2023-11-27 NOTE — Assessment & Plan Note (Signed)
 On atorvastatin  and ezetimibe  for cholesterol management with inconsistent adherence, including occasional missed doses. No recent lipid panel results available. - Continue atorvastatin  and ezetimibe  as prescribed - Encourage adherence to medication regimen

## 2023-11-27 NOTE — Progress Notes (Signed)
 Dell Fennel, DNP, AGNP-c [facility]  [address] [place], [place] [address] [phone]  ESTABLISHED PATIENT- Chronic Health and/or Follow-Up Visit  Blood pressure 132/84, pulse 78.   History of Present Illness Madison Calhoun is a 73 year old female with a history of stents who presents with fatigue and back pain.  She experiences persistent fatigue, particularly after physical activities such as walking her dogs or taking out the trash. She can walk for about five to ten minutes before needing to rest due to fatigue. For longer distances, such as on cruises, she uses a scooter and aims for 1,000 to 1,200 steps a day. Occasionally, she stays in bed when not feeling well and sometimes misses her nighttime medication due to falling asleep.  She has back pain, particularly on the left side, described as nerve pain that feels like 'someone took a needle or it's like the ice pick.' This pain has been ongoing for about a month and worsens with walking. Some relief is found by bending down or turning to the side. The pain did not occur during her recent cruise.  Her current medications include atorvastatin  and ezetimibe  for cholesterol management. She takes a big pill at night and a smaller one in the morning, sometimes taking both at night if she misses the evening dose. She also takes Tylenol  for pain management. She drinks water regularly, using up a stockpile she purchased years ago.  No shortness of breath, leg pain, or chest pain during physical activity. No changes in bowel or bladder habits. Her hands are always cold but feel good. She recalls swelling in her feet once while on a cruise but not during a trip to [place] .  All ROS negative with exception of what is listed above.   PHYSICAL EXAM Physical Exam Vitals and nursing note reviewed.  Constitutional:      Appearance: Normal appearance.  HENT:     Head: Normocephalic.   Eyes:     Conjunctiva/sclera:  Conjunctivae normal.    Cardiovascular:     Rate and Rhythm: Normal rate and regular rhythm.     Pulses: Normal pulses.     Heart sounds: Normal heart sounds.  Pulmonary:     Effort: Pulmonary effort is normal.     Breath sounds: Normal breath sounds.  Abdominal:     General: There is no distension.     Palpations: Abdomen is soft.     Tenderness: There is no abdominal tenderness. There is no guarding.   Musculoskeletal:        General: Tenderness present. Normal range of motion.     Cervical back: Normal range of motion.     Right lower leg: No edema.     Left lower leg: No edema.     Comments: Pain in the left buttocks radiating down the back of the leg.    Skin:    General: Skin is warm and dry.     Capillary Refill: Capillary refill takes less than 2 seconds.   Neurological:     General: No focal deficit present.     Mental Status: She is alert and oriented to person, place, and time.   Psychiatric:        Mood and Affect: Mood normal.        Behavior: Behavior normal.    PLAN Problem List Items Addressed This Visit     Lower extremity edema (Chronic)   No concerns today. Recommend elevation of legs to help reduce risk.  Morbid obesity (HCC) (Chronic)   Recommend weight management to aid in chronic disease management and overall healthy lifestyle. Currently walking goal is 1000 steps a day with reports of fatigue and need to rest with short walking. Currently utilizing a scooter. Strongly recommend low impact exercise to aid in cardiovascular health, such as pool work out.       Pre-diabetes (Chronic)   Repeat A1c today. If diabetic level, consider GLP-1      Relevant Orders   Hemoglobin A1c   CBC with Differential/Platelet   Comprehensive metabolic panel with GFR   Hyperlipidemia with target low density lipoprotein (LDL) cholesterol less than 55 mg/dL (Chronic)   On atorvastatin  and ezetimibe  for cholesterol management with inconsistent adherence,  including occasional missed doses. No recent lipid panel results available. - Continue atorvastatin  and ezetimibe  as prescribed - Encourage adherence to medication regimen      Relevant Medications   atorvastatin  (LIPITOR) 40 MG tablet   ezetimibe  (ZETIA ) 10 MG tablet   Other Relevant Orders   Lipid panel   CKD (chronic kidney disease) stage 3, GFR 30-59 ml/min (HCC) - Primary (Chronic)   Repeat labs today.       Relevant Orders   Comprehensive metabolic panel with GFR   Hypothyroidism   Chronic fatigue potentially related to thyroid  dysfunction. No other specific thyroid -related symptoms reported. Thyroid  function assessment is necessary to determine if medication adjustment is required. - Order thyroid  function tests - Hold thyroid  medication refill pending lab results      Relevant Orders   TSH   T4, free   Iron deficiency anemia due to chronic blood loss   Repeat labs today.       Relevant Orders   Iron, TIBC and Ferritin Panel   RESOLVED: Sciatica of left side   Relevant Orders   Ambulatory referral to Sports Medicine   Acute left-sided low back pain with left-sided sciatica   Chronic back pain with possible nerve involvement, presenting as a pinched nerve sensation on the left side. Symptoms include sharp, ice pick-like pain exacerbated by walking and slightly relieved by bending, persisting for over a month. Differential diagnosis includes degenerative spinal changes causing nerve compression and potential arthritis due to degeneration of spinal cushions. - Order lumbar spine x-ray to assess for degenerative changes - Provide stretching exercises to alleviate back pain - Refer to orthopedic specialist for further evaluation       Other Visit Diagnoses       Vitamin D  deficiency       Relevant Orders   VITAMIN D  25 Hydroxy (Vit-D Deficiency, Fractures)     Hyperlipidemia LDL goal <70  (Chronic)      Relevant Medications   atorvastatin  (LIPITOR) 40 MG tablet    ezetimibe  (ZETIA ) 10 MG tablet       No follow-ups on file.  Dell Fennel, DNP, AGNP-c

## 2023-11-27 NOTE — Assessment & Plan Note (Signed)
 Recommend weight management to aid in chronic disease management and overall healthy lifestyle. Currently walking goal is 1000 steps a day with reports of fatigue and need to rest with short walking. Currently utilizing a scooter. Strongly recommend low impact exercise to aid in cardiovascular health, such as pool work out.

## 2023-11-27 NOTE — Patient Instructions (Addendum)
 For the pain in your back, this is called sciatica. It is sometimes caused from arthritis in the back.  You can try the stretches below and try Tylenol  to help with the pain.  I will also send a referral to the orthopedic doctor to look at this.   Sciatica Rehab Stretching and range-of-motion exercises These exercises warm up your muscles and joints and improve the movement and flexibility of your hips and back. These exercises also help to relieve pain, numbness, and tingling. Sciatic nerve glide  Sit in a chair with your head facing down toward your chest. Place your hands behind your back. Let your shoulders slump forward. Slowly straighten one of your legs while you tilt your head back as if you are looking toward the ceiling. Only straighten your leg as far as you can without making your symptoms worse. Hold this position for _____10_____ seconds. Slowly return your leg and head back to the starting position. Repeat with your other leg. Repeat _____2_____ times. Complete this exercise _____2_____ times a day. Knee to chest with hip adduction and internal rotation  Lie on your back on a firm surface with both legs straight. Bend one of your knees and move it up toward your chest until you feel a gentle stretch in your lower back and buttock. Then, move your knee toward the shoulder that is on the opposite side from your leg. This is hip adduction and internal rotation. Hold your leg in this position by holding on to the front of your knee. Hold this position for ____10______ seconds. Slowly return to the starting position. Repeat with your other leg. Repeat _____2_____ times. Complete this exercise ______2____ times a day. Prone extension on elbows  Lie on your abdomen on a firm surface. A bed may be too soft for this exercise. Prop yourself up on your elbows. Use your arms to help lift your chest up until you feel a gentle stretch in your abdomen and your lower back. This will place  some of your body weight on your elbows. If this is uncomfortable, try stacking pillows under your chest. Your hips should stay down, against the surface that you are lying on. Keep your hip and back muscles relaxed. Hold this position for _____10_____ seconds. Slowly relax your upper body and return to the starting position. Repeat ____2______ times. Complete this exercise ______2____ times a day. Strengthening exercises These exercises build strength and endurance in your back. Endurance is the ability to use your muscles for a long time, even after they get tired. Pelvic tilt This exercise strengthens the muscles that lie deep in the abdomen. Lie on your back on a firm surface. Bend your knees and keep your feet flat on the surface. Tense your abdominal muscles. Tip your pelvis up toward the ceiling and flatten your lower back into the firm surface. To help with this exercise, you may place a small towel under your lower back and try to push your back into the towel. Hold this position for ____10______ seconds. Let your muscles relax completely before you repeat this exercise. Repeat _____2_____ times. Complete this exercise _____2_____ times a day. Alternating arm and leg raises  Get on your hands and knees on a firm surface. If you are on a hard floor, you may want to use padding, such as an exercise mat, to cushion your knees. Line up your arms and legs. Your hands should be directly below your shoulders, and your knees should be directly below your hips.  Lift your left leg behind you. At the same time, raise your right arm and straighten it in front of you. Do not lift your leg higher than your hip. Do not lift your arm higher than your shoulder. Keep your abdominal and back muscles tight. Keep your hips facing the ground. Do not arch your back. Keep your balance carefully, and do not hold your breath. Hold this position for ____10______ seconds. Slowly return to the starting  position. Repeat with your right leg and your left arm. Repeat ____2______ times. Complete this exercise ____2______ times a day. Posture and body mechanics Good posture and healthy body mechanics can help to relieve stress in your body's tissues and joints. Body mechanics refers to the movements and positions of your body while you do your daily activities. Posture is part of body mechanics. Good posture means: Your spine is in its natural S-curve position (neutral). Your shoulders are pulled back slightly. Your head is not tipped forward. Follow these guidelines to improve your posture and body mechanics in your everyday activities. Standing  When standing, keep your spine neutral and your feet about hip width apart. Keep a slight bend in your knees. Your ears, shoulders, and hips should line up. When you do a task in which you stand in one place for a long time, place one foot up on a stable object that is 2-4 inches (5-10 cm) high, such as a footstool. This helps keep your spine neutral. Sitting  When sitting, keep your spine neutral and keep your feet flat on the floor. Use a footrest, if necessary, and keep your thighs parallel to the floor. Avoid rounding your shoulders, and avoid tilting your head forward. When working at a desk or a computer, keep your desk at a height where your hands are slightly lower than your elbows. Slide your chair under your desk so you are close enough to maintain good posture. When working at a computer, place your monitor at a height where you are looking straight ahead and you do not have to tilt your head forward or downward to look at the screen. Resting  When lying down and resting, avoid positions that are most painful for you. If you have pain with activities such as sitting, bending, stooping, or squatting, lie in a position in which your body does not bend very much. For example, avoid curling up on your side with your arms and knees near your chest  (fetal position). If you have pain with activities such as standing for a long time or reaching with your arms, lie with your spine in a neutral position and bend your knees slightly. Try the following positions: Lying on your side with a pillow between your knees. Lying on your back with a pillow under your knees. Lifting  When lifting objects, keep your feet at least shoulder width apart and tighten your abdominal muscles. Bend your knees and hips and keep your spine neutral. It is important to lift using the strength of your legs, not your back. Do not lock your knees straight out. Always ask for help to lift heavy or awkward objects. This information is not intended to replace advice given to you by your health care provider. Make sure you discuss any questions you have with your health care provider. Document Revised: 09/03/2021 Document Reviewed: 09/03/2021 Elsevier Patient Education  2024 [employer].

## 2023-11-28 LAB — COMPREHENSIVE METABOLIC PANEL WITH GFR
ALT: 14 IU/L (ref 0–32)
AST: 15 IU/L (ref 0–40)
Albumin: 3.8 g/dL (ref 3.8–4.8)
Alkaline Phosphatase: 139 IU/L — ABNORMAL HIGH (ref 44–121)
BUN/Creatinine Ratio: 15 (ref 12–28)
BUN: 20 mg/dL (ref 8–27)
Bilirubin Total: 0.5 mg/dL (ref 0.0–1.2)
CO2: 21 mmol/L (ref 20–29)
Calcium: 9.2 mg/dL (ref 8.7–10.3)
Chloride: 109 mmol/L — ABNORMAL HIGH (ref 96–106)
Creatinine, Ser: 1.37 mg/dL — ABNORMAL HIGH (ref 0.57–1.00)
Globulin, Total: 2.6 g/dL (ref 1.5–4.5)
Glucose: 120 mg/dL — ABNORMAL HIGH (ref 70–99)
Potassium: 4.4 mmol/L (ref 3.5–5.2)
Sodium: 142 mmol/L (ref 134–144)
Total Protein: 6.4 g/dL (ref 6.0–8.5)
eGFR: 41 mL/min/{1.73_m2} — ABNORMAL LOW (ref >59)

## 2023-11-28 LAB — CBC WITH DIFFERENTIAL/PLATELET
Basophils Absolute: 0 10*3/uL (ref 0.0–0.2)
Basos: 1 %
EOS (ABSOLUTE): 0.1 10*3/uL (ref 0.0–0.4)
Eos: 2 %
Hematocrit: 41 % (ref 34.0–46.6)
Hemoglobin: 13 g/dL (ref 11.1–15.9)
Immature Grans (Abs): 0 10*3/uL (ref 0.0–0.1)
Immature Granulocytes: 0 %
Lymphocytes Absolute: 0.5 10*3/uL — ABNORMAL LOW (ref 0.7–3.1)
Lymphs: 10 %
MCH: 29.8 pg (ref 26.6–33.0)
MCHC: 31.7 g/dL (ref 31.5–35.7)
MCV: 94 fL (ref 79–97)
Monocytes Absolute: 0.4 10*3/uL (ref 0.1–0.9)
Monocytes: 9 %
Neutrophils Absolute: 3.8 10*3/uL (ref 1.4–7.0)
Neutrophils: 78 %
Platelets: 149 10*3/uL — ABNORMAL LOW (ref 150–450)
RBC: 4.36 x10E6/uL (ref 3.77–5.28)
RDW: 12.4 % (ref 11.7–15.4)
WBC: 4.9 10*3/uL (ref 3.4–10.8)

## 2023-11-28 LAB — LIPID PANEL
Chol/HDL Ratio: 2.9 ratio (ref 0.0–4.4)
Cholesterol, Total: 133 mg/dL (ref 100–199)
HDL: 46 mg/dL (ref >39)
LDL Chol Calc (NIH): 69 mg/dL (ref 0–99)
Triglycerides: 93 mg/dL (ref 0–149)
VLDL Cholesterol Cal: 18 mg/dL (ref 5–40)

## 2023-11-28 LAB — T4, FREE: Free T4: 1.23 ng/dL (ref 0.82–1.77)

## 2023-11-28 LAB — VITAMIN D 25 HYDROXY (VIT D DEFICIENCY, FRACTURES): Vit D, 25-Hydroxy: 21.4 ng/mL — ABNORMAL LOW (ref 30.0–100.0)

## 2023-11-28 LAB — IRON,TIBC AND FERRITIN PANEL
Ferritin: 311 ng/mL — ABNORMAL HIGH (ref 15–150)
Iron Saturation: 32 % (ref 15–55)
Iron: 92 ug/dL (ref 27–139)
Total Iron Binding Capacity: 285 ug/dL (ref 250–450)
UIBC: 193 ug/dL (ref 118–369)

## 2023-11-28 LAB — HEMOGLOBIN A1C
Est. average glucose Bld gHb Est-mCnc: 126 mg/dL
Hgb A1c MFr Bld: 6 % — ABNORMAL HIGH (ref 4.8–5.6)

## 2023-11-28 LAB — TSH: TSH: 4.31 u[IU]/mL (ref 0.450–4.500)

## 2023-12-02 ENCOUNTER — Ambulatory Visit: Payer: Self-pay | Admitting: Nurse Practitioner

## 2023-12-02 ENCOUNTER — Other Ambulatory Visit: Payer: Self-pay

## 2023-12-02 MED ORDER — VITAMIN D (ERGOCALCIFEROL) 1.25 MG (50000 UNIT) PO CAPS: 50000.0000 [IU] | ORAL_CAPSULE | ORAL | 1 refills | Status: DC

## 2023-12-21 ENCOUNTER — Telehealth: Payer: Self-pay | Admitting: Nurse Practitioner

## 2023-12-21 NOTE — Telephone Encounter (Unsigned)
 Copied from CRM [id number]. Topic: Referral - Status >> Dec 18, 2023  4:47 PM Graeme ORN wrote: Reason for CRM: Patient called to get number of location she was referred for leg. She decided she would like to make an appt but does not have the number. The referral says sports medicine but no number listed. Thank You

## 2024-01-03 ENCOUNTER — Other Ambulatory Visit: Payer: Self-pay | Admitting: Nurse Practitioner

## 2024-01-03 DIAGNOSIS — E039 Hypothyroidism, unspecified: Secondary | ICD-10-CM

## 2024-01-04 ENCOUNTER — Ambulatory Visit (INDEPENDENT_AMBULATORY_CARE_PROVIDER_SITE_OTHER): Admitting: Family Medicine

## 2024-01-04 VITALS — BP 128/86 | Ht 64.0 in

## 2024-01-04 DIAGNOSIS — G5702 Lesion of sciatic nerve, left lower limb: Secondary | ICD-10-CM | POA: Diagnosis not present

## 2024-01-04 NOTE — Progress Notes (Unsigned)
   Established Patient Office Visit  Subjective   Patient ID: Madison Calhoun, female    DOB: January 24, 1951  Age: 73 y.o. MRN: [id number]  No chief complaint on file.   Hip Pain  The incident occurred more than 1 week ago. The pain is present in the left hip. The quality of the pain is described as stabbing. The pain is at a severity of 10/10. The pain is severe. The pain has been Fluctuating since onset. Pertinent negatives include no loss of sensation, muscle weakness, numbness or tingling. The symptoms are aggravated by movement and weight bearing. She has tried acetaminophen  and non-weight bearing for the symptoms. The treatment provided mild relief.       Review of Systems  Genitourinary: Negative.   Musculoskeletal:  Positive for back pain and joint pain.  Neurological:  Negative for tingling, sensory change, focal weakness and numbness.      Objective:     BP 128/86   Ht 5' 4 (1.626 m)   BMI 41.16 kg/m     Physical Exam Musculoskeletal:     Lumbar back: No deformity. Normal range of motion. Positive left straight leg raise test.     Left hip: No tenderness or crepitus. Normal range of motion. Decreased strength (Pain and decreased strength with hip abduction).     Left ankle: Normal range of motion.     Left foot: Normal range of motion. No foot drop.     Comments: Strength in legs 5/5 bilaterally       No results found for any visits on 01/04/24.     The ASCVD Risk score (Arnett DK, et al., 2019) failed to calculate for the following reasons:   Risk score cannot be calculated because patient has a medical history suggesting prior/existing ASCVD    Assessment & Plan:   Problem List Items Addressed This Visit       Nervous and Auditory   Piriformis syndrome of left side - Primary   Left sided hip pain elicited by walking. Sensation intact bilaterally. Pain and decreased strength with hip abduction against resistance on left side. No weakness in leg or  foot. Likely piriformis syndrome. Pain management can be with Tylenol  or Voltaren gel. Pt given therapy exercises to perform at home which will help with pain alleviation and prevention. Return if pain does not improve or worsens.        No follow-ups on file.    Young Mulvey, Medical Student

## 2024-01-04 NOTE — Assessment & Plan Note (Signed)
 Left sided hip pain elicited by walking. Sensation intact bilaterally. Pain and decreased strength with hip abduction against resistance on left side. No weakness in leg or foot. Likely piriformis syndrome. Pain management can be with Tylenol  or Voltaren gel. Pt given therapy exercises to perform at home which will help with pain alleviation and prevention. Return if pain does not improve or worsens.

## 2024-01-04 NOTE — Patient Instructions (Signed)
 You have piriformis syndrome. Try to avoid painful activities when possible. Do home exercises and stretches daily. Topical voltaren gel up to 4 times a day as needed. Tylenol  500mg  1-2 tabs three times a day as needed. Heat 15 minutes at a time as needed. Consider physical therapy if you're struggling. Follow up with me in 6 weeks.

## 2024-01-05 ENCOUNTER — Encounter: Payer: Self-pay | Admitting: Family Medicine

## 2024-01-05 IMAGING — DX DG ANKLE COMPLETE 3+V*R*
3 series · 3 of 3 positions shown · non-contrast
Comparison: Right ankle and foot radiographs 10/07/2021; CT right
foot and ankle 10/07/2021

CLINICAL DATA: Right ankle joint, foot pain.  Fracture followup.

EXAM:
RIGHT ANKLE - COMPLETE 3+ VIEW

[ankle obl]
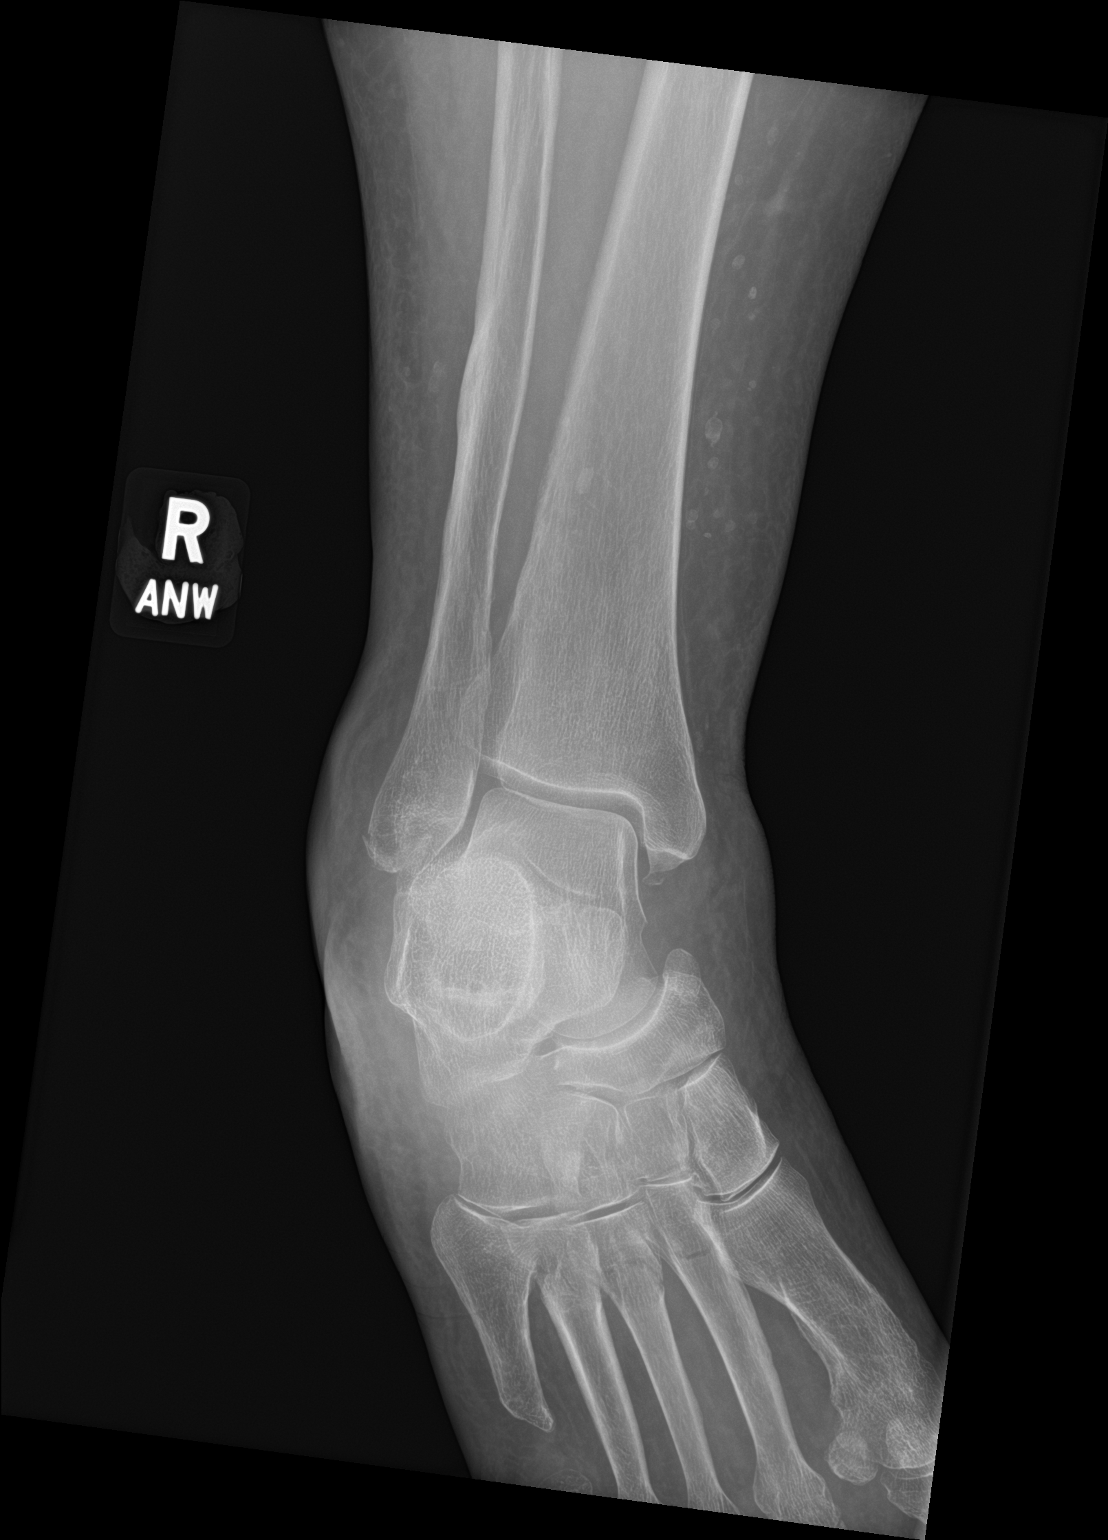

[ankle ap]
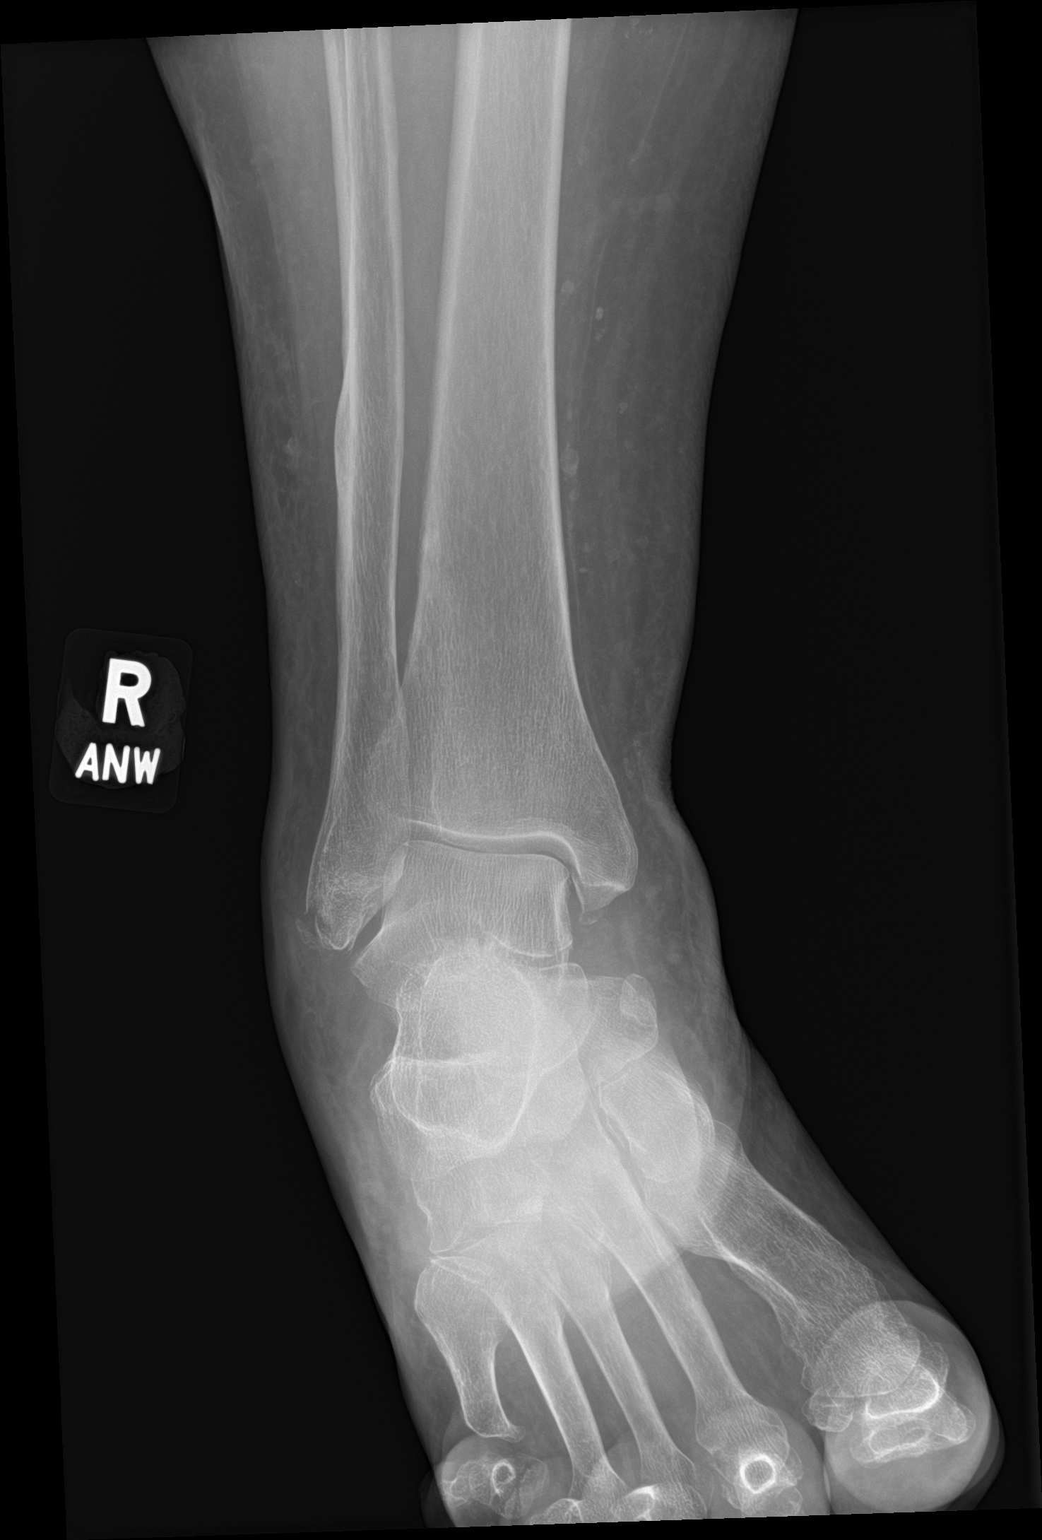

[ankle lat]
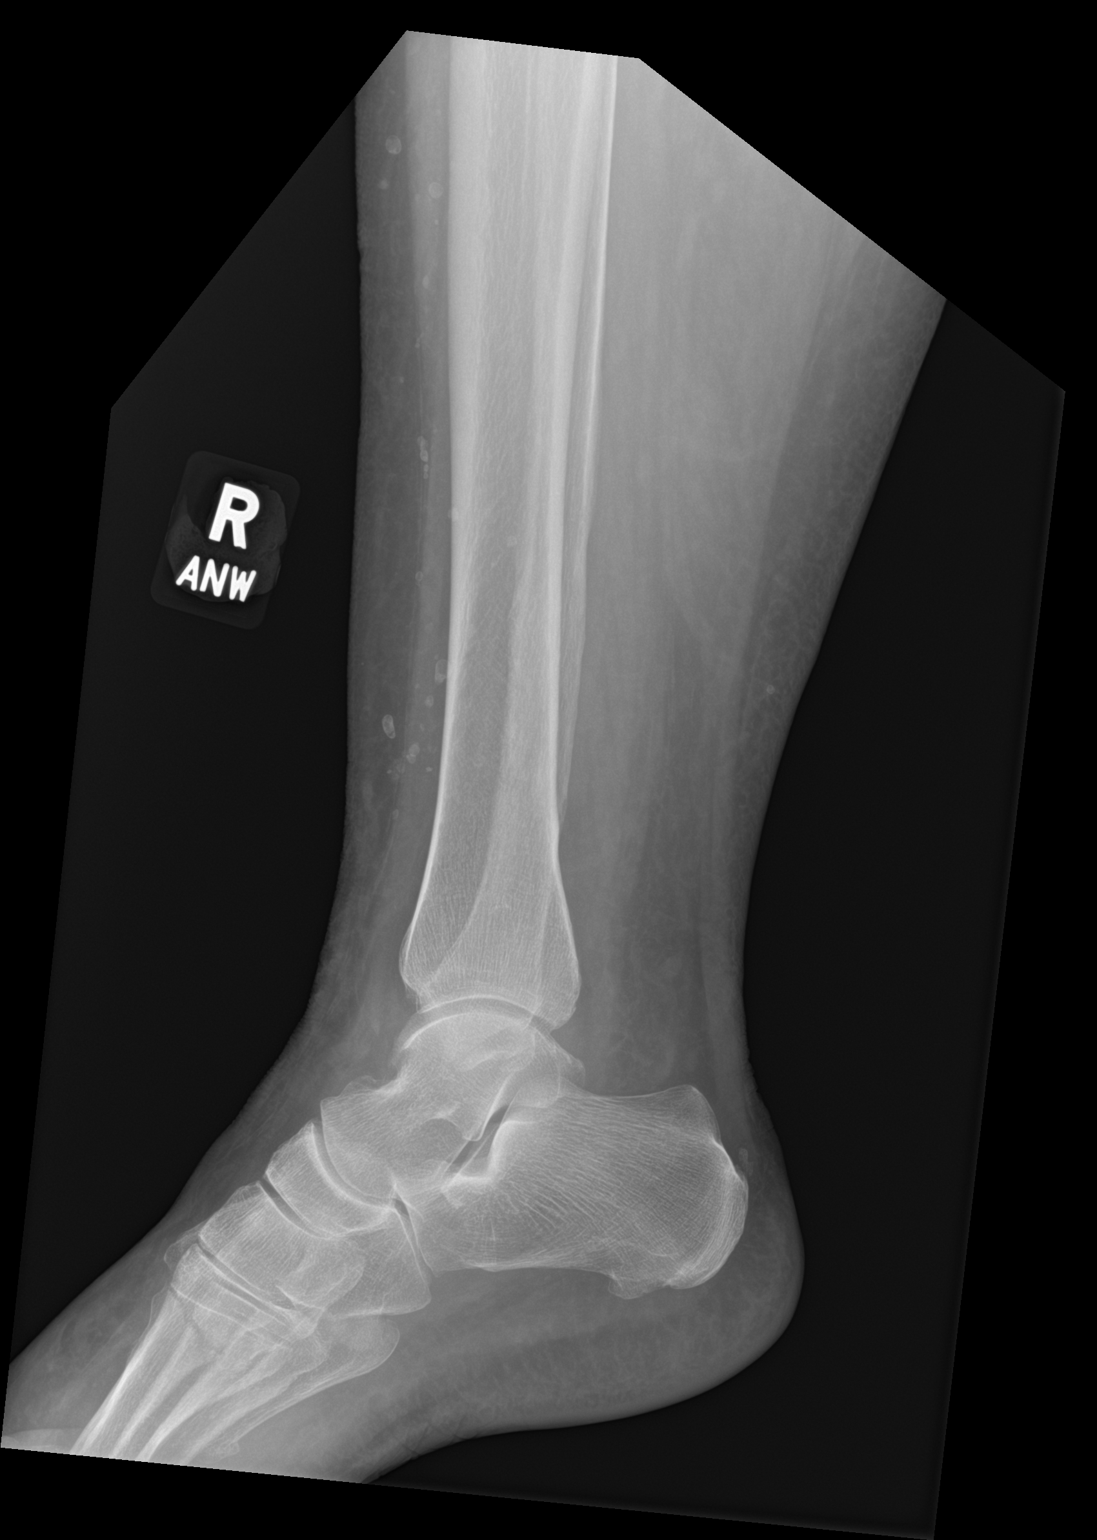

[3 of 3 positions shown; findings below may reference images not displayed]

FINDINGS: The ankle mortise is symmetric and intact. Redemonstration of mildly
displaced fracture of the distal tip of the fibula. Redemonstration
of minimally displaced fracture of the distal tip of the medial
malleolus. Tiny plantar calcaneal heel spur.

Acute nondisplaced fractures of the second through fourth proximal
metatarsal shafts and metadiaphyses are again seen.

The distal anterior talar body/dome superficial nondisplaced
fracture seen on 10/07/2021 CT is again not well visualized on
radiographs.

Remote amputation of the fifth metatarsal head.
IMPRESSION: Mildly displaced distal fibula, minimally displaced distal medial
malleolus nondisplaced second through fourth proximal metatarsal
acute to subacute fractures, not significantly changed.

## 2024-02-09 ENCOUNTER — Ambulatory Visit

## 2024-02-15 ENCOUNTER — Ambulatory Visit: Admitting: Family Medicine

## 2024-05-19 ENCOUNTER — Other Ambulatory Visit: Payer: Self-pay | Admitting: Nurse Practitioner

## 2024-05-20 NOTE — Telephone Encounter (Signed)
 11/27/23 last appt.

## 2024-05-23 ENCOUNTER — Other Ambulatory Visit: Payer: Self-pay

## 2024-05-23 DIAGNOSIS — E559 Vitamin D deficiency, unspecified: Secondary | ICD-10-CM

## 2024-05-26 ENCOUNTER — Other Ambulatory Visit

## 2024-05-26 DIAGNOSIS — E559 Vitamin D deficiency, unspecified: Secondary | ICD-10-CM

## 2024-05-27 LAB — VITAMIN D 25 HYDROXY (VIT D DEFICIENCY, FRACTURES): Vit D, 25-Hydroxy: 27.1 ng/mL — ABNORMAL LOW (ref 30.0–100.0)

## 2024-06-01 ENCOUNTER — Ambulatory Visit: Payer: Self-pay | Admitting: Nurse Practitioner
